# Patient Record
Sex: Male | Born: 1980 | Race: White | Hispanic: No | Marital: Single | Smoking: Current every day smoker
Health system: Southern US, Community
[De-identification: ages and names within clinical notes are randomized; demographics above are authoritative.]

## PROBLEM LIST (undated history)

## (undated) DIAGNOSIS — Z87442 Personal history of urinary calculi: Secondary | ICD-10-CM

## (undated) DIAGNOSIS — N200 Calculus of kidney: Secondary | ICD-10-CM

## (undated) DIAGNOSIS — N289 Disorder of kidney and ureter, unspecified: Secondary | ICD-10-CM

## (undated) HISTORY — PX: LITHOTRIPSY: SUR834

## (undated) HISTORY — PX: FINGER SURGERY: SHX640

---

## 2001-02-17 ENCOUNTER — Emergency Department (HOSPITAL_COMMUNITY): Admission: EM | Admit: 2001-02-17 | Discharge: 2001-02-17 | Payer: Self-pay | Admitting: Internal Medicine

## 2004-02-29 ENCOUNTER — Emergency Department (HOSPITAL_COMMUNITY): Admission: EM | Admit: 2004-02-29 | Discharge: 2004-02-29 | Payer: Self-pay | Admitting: Emergency Medicine

## 2004-03-08 ENCOUNTER — Ambulatory Visit (HOSPITAL_COMMUNITY): Admission: RE | Admit: 2004-03-08 | Discharge: 2004-03-08 | Payer: Self-pay | Admitting: Orthopaedic Surgery

## 2008-07-01 ENCOUNTER — Emergency Department (HOSPITAL_COMMUNITY): Admission: EM | Admit: 2008-07-01 | Discharge: 2008-07-02 | Payer: Self-pay | Admitting: Emergency Medicine

## 2010-07-02 NOTE — Op Note (Signed)
NAME:  Sean Mcdonald, Sean Mcdonald NO.:  [id number]   MEDICAL RECORD NO.:  [id number]          PATIENT TYPE:  OIB   LOCATION:  2899                         FACILITY:  [facility]   PHYSICIAN:  Mark C. Ophelia Charter, M.D.    DATE OF BIRTH:  [birth year]   DATE OF PROCEDURE:  03/08/2004  DATE OF DISCHARGE:  03/08/2004                                 OPERATIVE REPORT   PREOPERATIVE DIAGNOSIS:  Left long finger proximal phalanx fracture.   POSTOPERATIVE DIAGNOSIS:  Left long finger proximal phalanx fracture.   OPERATION PERFORMED:  Open reduction internal fixation of left long finger  proximal phalanx intra-articular fracture.   SURGEON:  Mark C. Ophelia Charter, M.D.   ESTIMATED BLOOD LOSS:  Less than 25 mL.   TOURNIQUET TIME:  None.   ANESTHESIA:  General orotracheal anesthesia.   DESCRIPTION OF PROCEDURE:  After induction of general anesthesia, the wrist  was prepped up to the midforearm level, extremity sheets were applied.  Under fluoroscopy, the finger was reduced and checked.  A stab incision was  made.  There was a fracture that was metaphyseal region of the left long  finger extended into the joint.  Initially, a small stab was made in the  skin, spread with a mosquito and 045 K-wire was used.  It was checked under  fluoroscopy with reduction maintained.  It was drilled across the fracture  site.  This held it with reduction maintained both externally with traction  and manual pressure.  A stab was made and a minifrag screw was placed after  drilling and placement of the screw.  Set screw was placed with some  difficulty with near anatomic position.  There was no angulated deformity.  Rotation looks good.  After irrigation, a single suture was placed for  closure of the skin incision that was made for the open reduction internal  fixation.  Hand dressing was applied.  The patient was taken to the recovery  room in stable condition.      MCY/MEDQ  D:  04/04/2004  T:  04/05/2004  Job:   [id number]

## 2013-10-13 DIAGNOSIS — N201 Calculus of ureter: Secondary | ICD-10-CM | POA: Insufficient documentation

## 2013-10-13 DIAGNOSIS — R109 Unspecified abdominal pain: Secondary | ICD-10-CM | POA: Insufficient documentation

## 2013-10-13 DIAGNOSIS — N200 Calculus of kidney: Secondary | ICD-10-CM | POA: Insufficient documentation

## 2016-04-06 DIAGNOSIS — Z87442 Personal history of urinary calculi: Secondary | ICD-10-CM | POA: Insufficient documentation

## 2016-06-17 ENCOUNTER — Encounter (HOSPITAL_COMMUNITY): Payer: Self-pay | Admitting: Emergency Medicine

## 2016-06-17 DIAGNOSIS — S80212A Abrasion, left knee, initial encounter: Secondary | ICD-10-CM | POA: Insufficient documentation

## 2016-06-17 DIAGNOSIS — S80211A Abrasion, right knee, initial encounter: Secondary | ICD-10-CM | POA: Insufficient documentation

## 2016-06-17 DIAGNOSIS — Y929 Unspecified place or not applicable: Secondary | ICD-10-CM | POA: Insufficient documentation

## 2016-06-17 DIAGNOSIS — Y999 Unspecified external cause status: Secondary | ICD-10-CM | POA: Insufficient documentation

## 2016-06-17 DIAGNOSIS — F17200 Nicotine dependence, unspecified, uncomplicated: Secondary | ICD-10-CM | POA: Insufficient documentation

## 2016-06-17 DIAGNOSIS — S0231XA Fracture of orbital floor, right side, initial encounter for closed fracture: Secondary | ICD-10-CM | POA: Insufficient documentation

## 2016-06-17 DIAGNOSIS — Y939 Activity, unspecified: Secondary | ICD-10-CM | POA: Insufficient documentation

## 2016-06-17 DIAGNOSIS — S50811A Abrasion of right forearm, initial encounter: Secondary | ICD-10-CM | POA: Insufficient documentation

## 2016-06-17 DIAGNOSIS — S022XXA Fracture of nasal bones, initial encounter for closed fracture: Secondary | ICD-10-CM | POA: Insufficient documentation
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
## 2016-06-18 ENCOUNTER — Emergency Department (HOSPITAL_COMMUNITY): Payer: Self-pay

## 2016-06-18 ENCOUNTER — Emergency Department (HOSPITAL_COMMUNITY)
Admission: EM | Admit: 2016-06-18 | Discharge: 2016-06-18 | Disposition: A | Discharge status: Home / Self Care | Payer: Self-pay | Attending: Emergency Medicine | Admitting: Emergency Medicine

## 2016-06-18 DIAGNOSIS — S0990XA Unspecified injury of head, initial encounter: Secondary | ICD-10-CM

## 2016-06-18 DIAGNOSIS — S022XXA Fracture of nasal bones, initial encounter for closed fracture: Secondary | ICD-10-CM

## 2016-06-18 DIAGNOSIS — T148XXA Other injury of unspecified body region, initial encounter: Secondary | ICD-10-CM

## 2016-06-18 DIAGNOSIS — S0285XA Fracture of orbit, unspecified, initial encounter for closed fracture: Secondary | ICD-10-CM

## 2016-06-18 MED ORDER — ONDANSETRON HCL 4 MG/2ML IJ SOLN
4.0000 mg | Freq: Once | INTRAMUSCULAR | Status: AC
  Administered 2016-06-18: 4 mg via INTRAVENOUS
  Filled 2016-06-18: qty 2

## 2016-06-18 MED ORDER — TETRACAINE HCL 0.5 % OP SOLN
1.0000 [drp] | Freq: Once | OPHTHALMIC | Status: AC
  Administered 2016-06-18: 1 [drp] via OPHTHALMIC
  Filled 2016-06-18: qty 4

## 2016-06-18 MED ORDER — HYDROMORPHONE HCL 1 MG/ML IJ SOLN
1.0000 mg | Freq: Once | INTRAMUSCULAR | Status: AC
  Administered 2016-06-18: 1 mg via INTRAVENOUS
  Filled 2016-06-18: qty 1

## 2016-06-18 MED ORDER — FLUORESCEIN SODIUM 0.6 MG OP STRP
1.0000 | ORAL_STRIP | Freq: Once | OPHTHALMIC | Status: AC
  Administered 2016-06-18: 1 via OPHTHALMIC
  Filled 2016-06-18: qty 1

## 2016-06-18 MED ORDER — HYDROCODONE-ACETAMINOPHEN 5-325 MG PO TABS: 1.0000 | ORAL_TABLET | ORAL | 0 refills | Status: DC | PRN

## 2016-06-18 NOTE — ED Notes (Signed)
Pt given breakfast tray
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
## 2016-06-18 NOTE — ED Notes (Signed)
Pt ambulated to the restroom without assistance

## 2016-12-30 ENCOUNTER — Emergency Department (HOSPITAL_BASED_OUTPATIENT_CLINIC_OR_DEPARTMENT_OTHER)

## 2016-12-30 ENCOUNTER — Encounter (HOSPITAL_BASED_OUTPATIENT_CLINIC_OR_DEPARTMENT_OTHER): Payer: Self-pay | Admitting: Emergency Medicine

## 2016-12-30 ENCOUNTER — Emergency Department (HOSPITAL_BASED_OUTPATIENT_CLINIC_OR_DEPARTMENT_OTHER)
Admission: EM | Admit: 2016-12-30 | Discharge: 2016-12-30 | Attending: Emergency Medicine | Admitting: Emergency Medicine

## 2016-12-30 ENCOUNTER — Other Ambulatory Visit: Payer: Self-pay

## 2016-12-30 DIAGNOSIS — Y929 Unspecified place or not applicable: Secondary | ICD-10-CM | POA: Diagnosis not present

## 2016-12-30 DIAGNOSIS — R51 Headache: Secondary | ICD-10-CM | POA: Insufficient documentation

## 2016-12-30 DIAGNOSIS — F17210 Nicotine dependence, cigarettes, uncomplicated: Secondary | ICD-10-CM | POA: Diagnosis not present

## 2016-12-30 DIAGNOSIS — Y9389 Activity, other specified: Secondary | ICD-10-CM | POA: Insufficient documentation

## 2016-12-30 DIAGNOSIS — M542 Cervicalgia: Secondary | ICD-10-CM | POA: Insufficient documentation

## 2016-12-30 DIAGNOSIS — M7918 Myalgia, other site: Secondary | ICD-10-CM | POA: Insufficient documentation

## 2016-12-30 DIAGNOSIS — M25512 Pain in left shoulder: Secondary | ICD-10-CM | POA: Diagnosis not present

## 2016-12-30 DIAGNOSIS — M25572 Pain in left ankle and joints of left foot: Secondary | ICD-10-CM | POA: Diagnosis present

## 2016-12-30 DIAGNOSIS — Y998 Other external cause status: Secondary | ICD-10-CM | POA: Diagnosis not present

## 2016-12-30 MED ORDER — CYCLOBENZAPRINE HCL 5 MG PO TABS: 5.0000 mg | ORAL_TABLET | Freq: Every evening | ORAL | 0 refills | Status: AC | PRN

## 2016-12-30 MED ORDER — NAPROXEN 500 MG PO TABS: 500.0000 mg | ORAL_TABLET | Freq: Two times a day (BID) | ORAL | 0 refills | Status: AC

## 2016-12-30 MED ORDER — ACETAMINOPHEN 500 MG PO TABS
1000.0000 mg | ORAL_TABLET | Freq: Once | ORAL | Status: AC
  Administered 2016-12-30: 1000 mg via ORAL
  Filled 2016-12-30: qty 2
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
## 2016-12-30 NOTE — Discharge Instructions (Signed)
Take naproxen twice a day with meals.  Do not take other anti-inflammatories at the same time open (Advil, Motrin, Ibuprofen, Aleve).  You may supplement with Tylenol if you need further pain control. Use Flexeril at night as needed for muscle stiffness or soreness.  Have caution, as this may make you tired/groggy. Use ice or heat this helps control your symptoms. Gently stretch the sore muscles.  You will likely have muscle stiffness and soreness over the next several days. If pain is not improving in a week, follow up with the primary care doctor for further evaluation. Return to the emergency room if you develop vision changes, slurred speech, new numbness, loss of bowel or bladder control, or any new or worsening symptoms.
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
## 2019-04-10 DIAGNOSIS — F331 Major depressive disorder, recurrent, moderate: Secondary | ICD-10-CM | POA: Insufficient documentation

## 2019-04-10 DIAGNOSIS — F1020 Alcohol dependence, uncomplicated: Secondary | ICD-10-CM | POA: Insufficient documentation

## 2019-04-10 DIAGNOSIS — F1510 Other stimulant abuse, uncomplicated: Secondary | ICD-10-CM | POA: Insufficient documentation

## 2019-04-10 DIAGNOSIS — Z720 Tobacco use: Secondary | ICD-10-CM | POA: Insufficient documentation

## 2019-09-24 ENCOUNTER — Encounter (HOSPITAL_COMMUNITY): Payer: Self-pay

## 2019-09-24 ENCOUNTER — Other Ambulatory Visit: Payer: Self-pay

## 2019-09-24 ENCOUNTER — Emergency Department (HOSPITAL_COMMUNITY)
Admission: EM | Admit: 2019-09-24 | Discharge: 2019-09-24 | Disposition: A | Discharge status: Home / Self Care | Attending: Emergency Medicine | Admitting: Emergency Medicine

## 2019-09-24 DIAGNOSIS — K4090 Unilateral inguinal hernia, without obstruction or gangrene, not specified as recurrent: Secondary | ICD-10-CM | POA: Insufficient documentation

## 2019-09-24 DIAGNOSIS — Z5321 Procedure and treatment not carried out due to patient leaving prior to being seen by health care provider: Secondary | ICD-10-CM | POA: Insufficient documentation

## 2019-09-24 HISTORY — DX: Disorder of kidney and ureter, unspecified: N28.9

## 2019-09-24 LAB — COMPREHENSIVE METABOLIC PANEL
ALT: 100 U/L — ABNORMAL HIGH (ref 0–44)
AST: 83 U/L — ABNORMAL HIGH (ref 15–41)
Albumin: 4.1 g/dL (ref 3.5–5.0)
Alkaline Phosphatase: 66 U/L (ref 38–126)
Anion gap: 10 (ref 5–15)
BUN: 11 mg/dL (ref 6–20)
CO2: 26 mmol/L (ref 22–32)
Calcium: 9.4 mg/dL (ref 8.9–10.3)
Chloride: 105 mmol/L (ref 98–111)
Creatinine, Ser: 0.87 mg/dL (ref 0.61–1.24)
GFR calc Af Amer: >60 mL/min (ref >60)
GFR calc non Af Amer: >60 mL/min (ref >60)
Glucose, Bld: 75 mg/dL (ref 70–99)
Potassium: 4.2 mmol/L (ref 3.5–5.1)
Sodium: 141 mmol/L (ref 135–145)
Total Bilirubin: 0.7 mg/dL (ref 0.3–1.2)
Total Protein: 7.5 g/dL (ref 6.5–8.1)

## 2019-09-24 LAB — URINALYSIS, ROUTINE W REFLEX MICROSCOPIC
Bacteria, UA: NONE SEEN
Bilirubin Urine: NEGATIVE
Glucose, UA: NEGATIVE mg/dL
Ketones, ur: NEGATIVE mg/dL
Leukocytes,Ua: NEGATIVE
Nitrite: NEGATIVE
Protein, ur: NEGATIVE mg/dL
RBC / HPF: >50 RBC/hpf — ABNORMAL HIGH (ref 0–5)
Specific Gravity, Urine: 1.021 (ref 1.005–1.030)
pH: 5 (ref 5.0–8.0)

## 2019-09-24 LAB — CBC WITH DIFFERENTIAL/PLATELET
Abs Immature Granulocytes: 0.02 10*3/uL (ref 0.00–0.07)
Basophils Absolute: 0.1 10*3/uL (ref 0.0–0.1)
Basophils Relative: 1 %
Eosinophils Absolute: 0.3 10*3/uL (ref 0.0–0.5)
Eosinophils Relative: 5 %
HCT: 48.8 % (ref 39.0–52.0)
Hemoglobin: 15.7 g/dL (ref 13.0–17.0)
Immature Granulocytes: 0 %
Lymphocytes Relative: 26 %
Lymphs Abs: 1.6 10*3/uL (ref 0.7–4.0)
MCH: 30.1 pg (ref 26.0–34.0)
MCHC: 32.2 g/dL (ref 30.0–36.0)
MCV: 93.5 fL (ref 80.0–100.0)
Monocytes Absolute: 0.6 10*3/uL (ref 0.1–1.0)
Monocytes Relative: 10 %
Neutro Abs: 3.6 10*3/uL (ref 1.7–7.7)
Neutrophils Relative %: 58 %
Platelets: 231 10*3/uL (ref 150–400)
RBC: 5.22 MIL/uL (ref 4.22–5.81)
RDW: 13.5 % (ref 11.5–15.5)
WBC: 6.2 10*3/uL (ref 4.0–10.5)
nRBC: 0 % (ref 0.0–0.2)

## 2019-09-24 NOTE — ED Triage Notes (Signed)
Pt reports has had left inguinal hernia for the past year.  Reports has been gradually getting bigger and has intermittent pain.  Reports pain got worse yesterday after lifting something.  Describes as feeling like something pulling in lower abd.  Denies any n/v/d.

## 2019-09-27 ENCOUNTER — Encounter (HOSPITAL_COMMUNITY): Payer: Self-pay | Admitting: Emergency Medicine

## 2019-09-27 ENCOUNTER — Emergency Department (HOSPITAL_COMMUNITY)
Admission: EM | Admit: 2019-09-27 | Discharge: 2019-09-27 | Disposition: A | Discharge status: Home / Self Care | Payer: Self-pay | Attending: Emergency Medicine | Admitting: Emergency Medicine

## 2019-09-27 ENCOUNTER — Emergency Department (HOSPITAL_COMMUNITY): Payer: Self-pay

## 2019-09-27 ENCOUNTER — Other Ambulatory Visit: Payer: Self-pay

## 2019-09-27 DIAGNOSIS — K4090 Unilateral inguinal hernia, without obstruction or gangrene, not specified as recurrent: Secondary | ICD-10-CM | POA: Insufficient documentation

## 2019-09-27 DIAGNOSIS — F17210 Nicotine dependence, cigarettes, uncomplicated: Secondary | ICD-10-CM | POA: Insufficient documentation

## 2019-09-27 DIAGNOSIS — R1030 Lower abdominal pain, unspecified: Secondary | ICD-10-CM | POA: Insufficient documentation

## 2019-09-27 HISTORY — DX: Calculus of kidney: N20.0

## 2019-09-27 LAB — COMPREHENSIVE METABOLIC PANEL
ALT: 83 U/L — ABNORMAL HIGH (ref 0–44)
AST: 63 U/L — ABNORMAL HIGH (ref 15–41)
Albumin: 3.6 g/dL (ref 3.5–5.0)
Alkaline Phosphatase: 59 U/L (ref 38–126)
Anion gap: 8 (ref 5–15)
BUN: 18 mg/dL (ref 6–20)
CO2: 24 mmol/L (ref 22–32)
Calcium: 8.8 mg/dL — ABNORMAL LOW (ref 8.9–10.3)
Chloride: 104 mmol/L (ref 98–111)
Creatinine, Ser: 0.79 mg/dL (ref 0.61–1.24)
GFR calc Af Amer: >60 mL/min (ref >60)
GFR calc non Af Amer: >60 mL/min (ref >60)
Glucose, Bld: 96 mg/dL (ref 70–99)
Potassium: 4.1 mmol/L (ref 3.5–5.1)
Sodium: 136 mmol/L (ref 135–145)
Total Bilirubin: 0.5 mg/dL (ref 0.3–1.2)
Total Protein: 7 g/dL (ref 6.5–8.1)

## 2019-09-27 LAB — URINALYSIS, ROUTINE W REFLEX MICROSCOPIC
Bacteria, UA: NONE SEEN
Bilirubin Urine: NEGATIVE
Glucose, UA: NEGATIVE mg/dL
Ketones, ur: NEGATIVE mg/dL
Leukocytes,Ua: NEGATIVE
Nitrite: NEGATIVE
Protein, ur: NEGATIVE mg/dL
RBC / HPF: >50 RBC/hpf — ABNORMAL HIGH (ref 0–5)
Specific Gravity, Urine: 1.026 (ref 1.005–1.030)
pH: 5 (ref 5.0–8.0)

## 2019-09-27 LAB — CBC WITH DIFFERENTIAL/PLATELET
Abs Immature Granulocytes: 0.02 10*3/uL (ref 0.00–0.07)
Basophils Absolute: 0.1 10*3/uL (ref 0.0–0.1)
Basophils Relative: 1 %
Eosinophils Absolute: 0.3 10*3/uL (ref 0.0–0.5)
Eosinophils Relative: 4 %
HCT: 46.7 % (ref 39.0–52.0)
Hemoglobin: 14.7 g/dL (ref 13.0–17.0)
Immature Granulocytes: 0 %
Lymphocytes Relative: 23 %
Lymphs Abs: 1.6 10*3/uL (ref 0.7–4.0)
MCH: 29.3 pg (ref 26.0–34.0)
MCHC: 31.5 g/dL (ref 30.0–36.0)
MCV: 93 fL (ref 80.0–100.0)
Monocytes Absolute: 0.8 10*3/uL (ref 0.1–1.0)
Monocytes Relative: 11 %
Neutro Abs: 4.1 10*3/uL (ref 1.7–7.7)
Neutrophils Relative %: 61 %
Platelets: 216 10*3/uL (ref 150–400)
RBC: 5.02 MIL/uL (ref 4.22–5.81)
RDW: 13.4 % (ref 11.5–15.5)
WBC: 6.8 10*3/uL (ref 4.0–10.5)
nRBC: 0 % (ref 0.0–0.2)

## 2019-09-27 MED ORDER — KETOROLAC TROMETHAMINE 60 MG/2ML IM SOLN
60.0000 mg | Freq: Once | INTRAMUSCULAR | Status: AC
  Administered 2019-09-27: 60 mg via INTRAMUSCULAR
  Filled 2019-09-27: qty 2

## 2019-09-27 MED ORDER — HYDROCODONE-ACETAMINOPHEN 5-325 MG PO TABS: 1.0000 | ORAL_TABLET | Freq: Four times a day (QID) | ORAL | 0 refills | Status: DC | PRN

## 2019-09-27 NOTE — ED Provider Notes (Signed)
[facility] EMERGENCY DEPARTMENT Provider Note   CSN: [id number] Arrival date & time: 09/27/19  6948     History No chief complaint on file.   Sean Mcdonald is a 39 y.o. male.  Patient complains of left inguinal pain.  Patient has a history of an inguinal hernia that he is supposed to see surgery about  The history is provided by the patient.  Abdominal Pain Pain location: Left inguinal area. Pain quality: aching   Pain radiates to:  Does not radiate Pain severity:  Mild Onset quality:  Gradual Timing:  Constant Progression:  Worsening Chronicity:  Recurrent Context: not alcohol use   Relieved by:  Nothing Worsened by:  Nothing Ineffective treatments:  None tried Associated symptoms: no chest pain, no cough, no diarrhea, no fatigue and no hematuria        Past Medical History:  Diagnosis Date  . Kidney stones   . Kidney stones   . Renal disorder    kidney stones    There are no problems to display for this patient.   Past Surgical History:  Procedure Laterality Date  . FINGER SURGERY    . LITHOTRIPSY         History reviewed. No pertinent family history.  Social History   Tobacco Use  . Smoking status: Current Every Day Smoker    Packs/day: 0.50    Types: Cigarettes  . Smokeless tobacco: Never Used  Vaping Use  . Vaping Use: Never used  Substance Use Topics  . Alcohol use: No  . Drug use: Never    Home Medications Prior to Admission medications   Medication Sig Start Date End Date Taking? Authorizing Provider  HYDROcodone-acetaminophen (NORCO/VICODIN) 5-325 MG tablet Take 1-2 tablets by mouth every 4 (four) hours as needed for moderate pain. 06/18/16   Orpah Greek, MD    Allergies    Patient has no known allergies.  Review of Systems   Review of Systems  Constitutional: Negative for appetite change and fatigue.  HENT: Negative for congestion, ear discharge and sinus pressure.   Eyes: Negative for discharge.  Respiratory:  Negative for cough.   Cardiovascular: Negative for chest pain.  Gastrointestinal: Positive for abdominal pain. Negative for diarrhea.  Genitourinary: Negative for frequency and hematuria.  Musculoskeletal: Negative for back pain.  Skin: Negative for rash.  Neurological: Negative for seizures and headaches.  Psychiatric/Behavioral: Negative for hallucinations.    Physical Exam Updated Vital Signs BP 124/77 (BP Location: Left Arm)   Pulse 68   Temp 97.9 F (36.6 C) (Oral)   Resp 18   Ht 6\' 1"  (1.854 m)   Wt 81.6 kg   SpO2 98%   BMI 23.75 kg/m   Physical Exam Vitals and nursing note reviewed.  Constitutional:      Appearance: He is well-developed.  HENT:     Head: Normocephalic.     Nose: Nose normal.  Eyes:     General: No scleral icterus.    Conjunctiva/sclera: Conjunctivae normal.  Neck:     Thyroid: No thyromegaly.  Cardiovascular:     Rate and Rhythm: Normal rate and regular rhythm.     Heart sounds: No murmur heard.  No friction rub. No gallop.   Pulmonary:     Breath sounds: No stridor. No wheezing or rales.  Chest:     Chest wall: No tenderness.  Abdominal:     General: There is no distension.     Tenderness: There is no abdominal tenderness. There  is no rebound.  Genitourinary:    Comments: Tender left inguinal area.   Mass consistent with hernia Musculoskeletal:        General: Normal range of motion.     Cervical back: Neck supple.  Lymphadenopathy:     Cervical: No cervical adenopathy.  Skin:    Findings: No erythema or rash.  Neurological:     Mental Status: He is alert and oriented to person, place, and time.     Motor: No abnormal muscle tone.     Coordination: Coordination normal.  Psychiatric:        Behavior: Behavior normal.     ED Results / Procedures / Treatments   Labs (all labs ordered are listed, but only abnormal results are displayed) Labs Reviewed  URINALYSIS, ROUTINE W REFLEX MICROSCOPIC - Abnormal; Notable for the following  components:      Result Value   Hgb urine dipstick LARGE (*)    RBC / HPF >50 (*)    All other components within normal limits  COMPREHENSIVE METABOLIC PANEL - Abnormal; Notable for the following components:   Calcium 8.8 (*)    AST 63 (*)    ALT 83 (*)    All other components within normal limits  URINE CULTURE  CBC WITH DIFFERENTIAL/PLATELET    EKG None  Radiology CT Renal Stone Study  Result Date: 09/27/2019 CLINICAL DATA:  Left lower quadrant abdominal pain for 1 year. EXAM: CT ABDOMEN AND PELVIS WITHOUT CONTRAST TECHNIQUE: Multidetector CT imaging of the abdomen and pelvis was performed following the standard protocol without IV contrast. COMPARISON:  12/19/2017 FINDINGS: Lower chest: Minimal reticular scarring, lateral base of the right lower lobe, stable. Lung bases otherwise clear. Hepatobiliary: No focal liver abnormality is seen. No gallstones, gallbladder wall thickening, or biliary dilatation. Pancreas: Unremarkable. No pancreatic ductal dilatation or surrounding inflammatory changes. Spleen: Normal in size without focal abnormality. Adrenals/Urinary Tract: No adrenal masses. Kidneys normal in size, orientation and position. Small nonobstructing stones in the left kidney. Low-attenuation masses or lesions, medial mid to lower pole and lower pole of the right kidney, 12 mm each, not fully characterized but likely cysts. No other renal masses. No hydronephrosis. Normal ureters. Normal bladder. Stomach/Bowel: Stomach is within normal limits. Appendix appears normal. No evidence of bowel wall thickening, distention, or inflammatory changes. Vascular/Lymphatic: No significant vascular findings are present. No enlarged abdominal or pelvic lymph nodes. Reproductive: Unremarkable. Other: Fat containing left inguinal hernia. No other abdominal wall hernia. No ascites. Musculoskeletal: No acute or significant osseous findings. IMPRESSION: 1. No acute findings within the abdomen or pelvis. 2.  Small fat containing left inguinal hernia new/increased in size from the prior CT, potentially a cause of left lower quadrant pain. 3. No ureteral stone or obstructive uropathy. Small nonobstructing stones in the left kidney. 4. Two small low-density lesions in the right kidney, likely cyst but not fully characterized. Recommend non urgent follow-up assessment with renal ultrasound Electronically Signed   By: Lajean Manes M.D.   On: 09/27/2019 12:05    Procedures Procedures (including critical care time)  Medications Ordered in ED Medications  ketorolac (TORADOL) injection 60 mg (60 mg Intramuscular Given 09/27/19 1158)    ED Course  I have reviewed the triage vital signs and the nursing notes.  Pertinent labs & imaging results that were available during my care of the patient were reviewed by me and considered in my medical decision making (see chart for details).    MDM Rules/Calculators/A&P  Patient with left inguinal hernia which he has been referred back to surgery.  Patient also with hematuria and he is referred to urology     This patient presents to the ED for concern of inguinal pain, this involves an extensive number of treatment options, and is a complaint that carries with it a high risk of complications and morbidity.  The differential diagnosis includes inguinal hernia kidney stone  Lab Tests:  I Ordered, reviewed, and interpreted labs, which included CBC chemistries which were unremarkable.  Patient does have elevated liver enzymes Medicines ordered:   I ordered medication Toradol for pain  Imaging Studies ordered:   I ordered imaging studies which included CT abdomen  I independently visualized and interpreted imaging which showed kidney stones and kidney and left inguinal hernia no strangulation chest felt same  Additional history obtained:   Additional history obtained from records  Previous records obtained and  reviewed.  Consultations Obtained:   Reevaluation:  After the interventions stated above, I reevaluated the patient and found improved  Critical Interventions:  .   Final Clinical Impression(s) / ED Diagnoses Final diagnoses:  Lower abdominal pain    Rx / DC Orders ED Discharge Orders    None       Milton Ferguson, MD 09/29/19 1347

## 2019-09-27 NOTE — Discharge Instructions (Addendum)
Follow-up with Dr. Arnoldo Morale to get your hernia repaired.  Follow-up with [facility] to check on the blood in your urine.  Do no lifting until you get that hernia fixed.  Return if any problems

## 2019-09-27 NOTE — ED Notes (Signed)
Pt given strainer at discharge. Pt left prior to e-signature being obtained. Pt verbalized understanding of discharge instructions and need for follow-up.

## 2019-09-27 NOTE — ED Notes (Signed)
Patient transported to CT 

## 2019-09-27 NOTE — ED Triage Notes (Signed)
Patient states he has a hernia around his groin area the size of a golf ball.  abd pain and a pulling sensation.

## 2019-09-27 NOTE — ED Provider Notes (Signed)
[facility] EMERGENCY DEPARTMENT Provider Note   CSN: [id number] Arrival date & time: 09/27/19  7510     History No chief complaint on file.   Sean Mcdonald is a 39 y.o. male.  Patient complains of lower abdominal pain.  He states he has been having this pain lately and has been doing some lifting and it is worse  The history is provided by the patient and medical records. No language interpreter was used.  Abdominal Pain Pain location: Left inguinal pain. Pain quality: aching   Pain radiates to:  Does not radiate Pain severity:  Moderate Onset quality:  Sudden Progression:  Worsening Chronicity:  New Context: not alcohol use   Associated symptoms: no chest pain, no cough, no diarrhea, no fatigue and no hematuria        Past Medical History:  Diagnosis Date  . Kidney stones   . Kidney stones   . Renal disorder    kidney stones    There are no problems to display for this patient.   Past Surgical History:  Procedure Laterality Date  . FINGER SURGERY    . LITHOTRIPSY         History reviewed. No pertinent family history.  Social History   Tobacco Use  . Smoking status: Current Every Day Smoker    Packs/day: 0.50    Types: Cigarettes  . Smokeless tobacco: Never Used  Vaping Use  . Vaping Use: Never used  Substance Use Topics  . Alcohol use: No  . Drug use: Never    Home Medications Prior to Admission medications   Medication Sig Start Date End Date Taking? Authorizing Provider  HYDROcodone-acetaminophen (NORCO/VICODIN) 5-325 MG tablet Take 1-2 tablets by mouth every 4 (four) hours as needed for moderate pain. 06/18/16   Orpah Greek, MD    Allergies    Patient has no known allergies.  Review of Systems   Review of Systems  Constitutional: Negative for appetite change and fatigue.  HENT: Negative for congestion, ear discharge and sinus pressure.   Eyes: Negative for discharge.  Respiratory: Negative for cough.   Cardiovascular:  Negative for chest pain.  Gastrointestinal: Positive for abdominal pain. Negative for diarrhea.  Genitourinary: Negative for frequency and hematuria.  Musculoskeletal: Negative for back pain.  Skin: Negative for rash.  Neurological: Negative for seizures and headaches.  Psychiatric/Behavioral: Negative for hallucinations.    Physical Exam Updated Vital Signs BP 124/77 (BP Location: Left Arm)   Pulse 68   Temp 97.9 F (36.6 C) (Oral)   Resp 18   Ht 6\' 1"  (1.854 m)   Wt 81.6 kg   SpO2 98%   BMI 23.75 kg/m   Physical Exam Vitals and nursing note reviewed.  Constitutional:      Appearance: He is well-developed.  HENT:     Head: Normocephalic.     Nose: Nose normal.  Eyes:     General: No scleral icterus.    Conjunctiva/sclera: Conjunctivae normal.  Neck:     Thyroid: No thyromegaly.  Cardiovascular:     Rate and Rhythm: Normal rate and regular rhythm.     Heart sounds: No murmur heard.  No friction rub. No gallop.   Pulmonary:     Breath sounds: No stridor. No wheezing or rales.  Chest:     Chest wall: No tenderness.  Abdominal:     General: There is no distension.     Tenderness: There is abdominal tenderness. There is no rebound.  Comments: Patient has an inguinal hernia on the left side that is reducible  Musculoskeletal:        General: Normal range of motion.     Cervical back: Neck supple.  Lymphadenopathy:     Cervical: No cervical adenopathy.  Skin:    Findings: No erythema or rash.  Neurological:     Mental Status: He is alert and oriented to person, place, and time.     Motor: No abnormal muscle tone.     Coordination: Coordination normal.  Psychiatric:        Behavior: Behavior normal.     ED Results / Procedures / Treatments   Labs (all labs ordered are listed, but only abnormal results are displayed) Labs Reviewed  URINALYSIS, ROUTINE W REFLEX MICROSCOPIC - Abnormal; Notable for the following components:      Result Value   Hgb urine  dipstick LARGE (*)    RBC / HPF >50 (*)    All other components within normal limits  COMPREHENSIVE METABOLIC PANEL - Abnormal; Notable for the following components:   Calcium 8.8 (*)    AST 63 (*)    ALT 83 (*)    All other components within normal limits  URINE CULTURE  CBC WITH DIFFERENTIAL/PLATELET    EKG None  Radiology CT Renal Stone Study  Result Date: 09/27/2019 CLINICAL DATA:  Left lower quadrant abdominal pain for 1 year. EXAM: CT ABDOMEN AND PELVIS WITHOUT CONTRAST TECHNIQUE: Multidetector CT imaging of the abdomen and pelvis was performed following the standard protocol without IV contrast. COMPARISON:  12/19/2017 FINDINGS: Lower chest: Minimal reticular scarring, lateral base of the right lower lobe, stable. Lung bases otherwise clear. Hepatobiliary: No focal liver abnormality is seen. No gallstones, gallbladder wall thickening, or biliary dilatation. Pancreas: Unremarkable. No pancreatic ductal dilatation or surrounding inflammatory changes. Spleen: Normal in size without focal abnormality. Adrenals/Urinary Tract: No adrenal masses. Kidneys normal in size, orientation and position. Small nonobstructing stones in the left kidney. Low-attenuation masses or lesions, medial mid to lower pole and lower pole of the right kidney, 12 mm each, not fully characterized but likely cysts. No other renal masses. No hydronephrosis. Normal ureters. Normal bladder. Stomach/Bowel: Stomach is within normal limits. Appendix appears normal. No evidence of bowel wall thickening, distention, or inflammatory changes. Vascular/Lymphatic: No significant vascular findings are present. No enlarged abdominal or pelvic lymph nodes. Reproductive: Unremarkable. Other: Fat containing left inguinal hernia. No other abdominal wall hernia. No ascites. Musculoskeletal: No acute or significant osseous findings. IMPRESSION: 1. No acute findings within the abdomen or pelvis. 2. Small fat containing left inguinal hernia  new/increased in size from the prior CT, potentially a cause of left lower quadrant pain. 3. No ureteral stone or obstructive uropathy. Small nonobstructing stones in the left kidney. 4. Two small low-density lesions in the right kidney, likely cyst but not fully characterized. Recommend non urgent follow-up assessment with renal ultrasound Electronically Signed   By: Lajean Manes M.D.   On: 09/27/2019 12:05    Procedures Procedures (including critical care time)  Medications Ordered in ED Medications  ketorolac (TORADOL) injection 60 mg (60 mg Intramuscular Given 09/27/19 1158)    ED Course  I have reviewed the triage vital signs and the nursing notes.  Pertinent labs & imaging results that were available during my care of the patient were reviewed by me and considered in my medical decision making (see chart for details).    MDM Rules/Calculators/A&P  Patient with inguinal hernia which is reducible.  Patient will be referred to general surgery.  Patient also has hematuria and he will be referred to urology       This patient presents to the ED for concern of patient with left inguinal pain, this involves an extensive number of treatment options, and is a complaint that carries with it a high risk of complications and morbidity.  The differential diagnosis includes inguinal hernia   Lab Tests:   I Ordered, reviewed, and interpreted labs, which included CBC and chemistries unremarkable.  Patient had urinalysis that showed hematuria  Medicines ordered:   I ordered medication Toradol for pain  Imaging Studies ordered:   I ordered imaging studies which included CT abdomen and  I independently visualized and interpreted imaging which showed showed inguinal hernia  Additional history obtained:   Additional history obtained from records  Previous records obtained and reviewed.  Consultations Obtained:   Patient was referred to urology and  general surgery  Reevaluation:  After the interventions stated above, I reevaluated the patient and found mild improvement  Critical Interventions:  .   Final Clinical Impression(s) / ED Diagnoses Final diagnoses:  Lower abdominal pain    Rx / DC Orders ED Discharge Orders    None       Milton Ferguson, MD 09/27/19 1401

## 2019-09-28 LAB — URINE CULTURE: Culture: NO GROWTH

## 2019-10-01 ENCOUNTER — Encounter: Payer: Self-pay | Admitting: General Surgery

## 2019-10-01 ENCOUNTER — Other Ambulatory Visit: Payer: Self-pay

## 2019-10-01 ENCOUNTER — Ambulatory Visit (INDEPENDENT_AMBULATORY_CARE_PROVIDER_SITE_OTHER): Payer: Self-pay | Admitting: General Surgery

## 2019-10-01 VITALS — BP 160/80 | HR 88 | Temp 98.2°F | Resp 12 | Ht 73.0 in | Wt 172.0 lb

## 2019-10-01 DIAGNOSIS — K4090 Unilateral inguinal hernia, without obstruction or gangrene, not specified as recurrent: Secondary | ICD-10-CM

## 2019-10-01 NOTE — Patient Instructions (Signed)

## 2019-10-02 NOTE — Progress Notes (Signed)
Sean Mcdonald; [id number]; [birth year]   HPI Patient is a 39 year old white male who was presented to my office from the emergency room for evaluation and treatment of a left inguinal hernia.  He states it has been present for many months, but is increased in size and is causing him discomfort.  He is having a difficult time reducing the hernia.  It is affecting his work. Past Medical History:  Diagnosis Date   Kidney stones    Kidney stones    Renal disorder    kidney stones    Past Surgical History:  Procedure Laterality Date   FINGER SURGERY     LITHOTRIPSY      History reviewed. No pertinent family history.  Current Outpatient Medications on File Prior to Visit  Medication Sig Dispense Refill   citalopram (CELEXA) 20 MG tablet Take by mouth.     FLUoxetine (PROZAC) 10 MG capsule Take 1 capsule by mouth daily.     topiramate (TOPAMAX) 50 MG tablet Take by mouth.     No current facility-administered medications on file prior to visit.    No Known Allergies  Social History   Substance and Sexual Activity  Alcohol Use Yes   Comment: occasionally    Social History   Tobacco Use  Smoking Status Current Every Day Smoker   Packs/day: 0.50   Types: Cigarettes  Smokeless Tobacco Never Used    Review of Systems  Constitutional: Positive for malaise/fatigue.  HENT: Negative.   Eyes: Positive for pain.  Respiratory: Negative.   Cardiovascular: Negative.   Gastrointestinal: Positive for abdominal pain.  Genitourinary: Negative.   Musculoskeletal: Negative.   Skin: Negative.   Neurological: Negative.   Endo/Heme/Allergies: Negative.   Psychiatric/Behavioral: Negative.     Objective   Vitals:   10/01/19 1028  BP: (!) 160/80  Pulse: 88  Resp: 12  Temp: 98.2 F (36.8 C)  SpO2: 97%    Physical Exam Vitals reviewed.  Constitutional:      Appearance: Normal appearance. He is normal weight. He is not ill-appearing.  HENT:     Head: Normocephalic and  atraumatic.  Cardiovascular:     Rate and Rhythm: Normal rate and regular rhythm.     Heart sounds: Normal heart sounds. No murmur heard.  No friction rub. No gallop.   Pulmonary:     Effort: Pulmonary effort is normal. No respiratory distress.     Breath sounds: Normal breath sounds. No stridor. No wheezing, rhonchi or rales.  Abdominal:     General: Abdomen is flat. There is no distension.     Palpations: Abdomen is soft. There is no mass.     Tenderness: There is no abdominal tenderness. There is no guarding or rebound.     Hernia: A hernia is present.     Comments: Reducible left inguinal hernia noted.  Genitourinary:    Comments: Genitourinary examination is within normal limits. Skin:    General: Skin is warm and dry.  Neurological:     Mental Status: He is alert and oriented to person, place, and time.   ER notes reviewed  Assessment  Left inguinal hernia Plan   Patient is scheduled for left inguinal herniorrhaphy with mesh on 10/09/2019.  The risks and benefits of the procedure including bleeding, infection, mesh use, the possibility of recurrence of the hernia were fully explained to the patient, who gave informed consent.

## 2019-10-02 NOTE — H&P (Signed)
Sean Mcdonald; [id number]; 1980/10/13   HPI Patient is a 38 year old white male who was presented to my office from the emergency room for evaluation and treatment of a left inguinal hernia.  He states it has been present for many months, but is increased in size and is causing him discomfort.  He is having a difficult time reducing the hernia.  It is affecting his work. Past Medical History:  Diagnosis Date  . Kidney stones   . Kidney stones   . Renal disorder    kidney stones    Past Surgical History:  Procedure Laterality Date  . FINGER SURGERY    . LITHOTRIPSY      History reviewed. No pertinent family history.  Current Outpatient Medications on File Prior to Visit  Medication Sig Dispense Refill  . citalopram (CELEXA) 20 MG tablet Take by mouth.    Marland Kitchen FLUoxetine (PROZAC) 10 MG capsule Take 1 capsule by mouth daily.    Marland Kitchen topiramate (TOPAMAX) 50 MG tablet Take by mouth.     No current facility-administered medications on file prior to visit.    No Known Allergies  Social History   Substance and Sexual Activity  Alcohol Use Yes   Comment: occasionally    Social History   Tobacco Use  Smoking Status Current Every Day Smoker  . Packs/day: 0.50  . Types: Cigarettes  Smokeless Tobacco Never Used    Review of Systems  Constitutional: Positive for malaise/fatigue.  HENT: Negative.   Eyes: Positive for pain.  Respiratory: Negative.   Cardiovascular: Negative.   Gastrointestinal: Positive for abdominal pain.  Genitourinary: Negative.   Musculoskeletal: Negative.   Skin: Negative.   Neurological: Negative.   Endo/Heme/Allergies: Negative.   Psychiatric/Behavioral: Negative.     Objective   Vitals:   10/01/19 1028  BP: (!) 160/80  Pulse: 88  Resp: 12  Temp: 98.2 F (36.8 C)  SpO2: 97%    Physical Exam Vitals reviewed.  Constitutional:      Appearance: Normal appearance. He is normal weight. He is not ill-appearing.  HENT:     Head: Normocephalic and  atraumatic.  Cardiovascular:     Rate and Rhythm: Normal rate and regular rhythm.     Heart sounds: Normal heart sounds. No murmur heard.  No friction rub. No gallop.   Pulmonary:     Effort: Pulmonary effort is normal. No respiratory distress.     Breath sounds: Normal breath sounds. No stridor. No wheezing, rhonchi or rales.  Abdominal:     General: Abdomen is flat. There is no distension.     Palpations: Abdomen is soft. There is no mass.     Tenderness: There is no abdominal tenderness. There is no guarding or rebound.     Hernia: A hernia is present.     Comments: Reducible left inguinal hernia noted.  Genitourinary:    Comments: Genitourinary examination is within normal limits. Skin:    General: Skin is warm and dry.  Neurological:     Mental Status: He is alert and oriented to person, place, and time.   ER notes reviewed  Assessment  Left inguinal hernia Plan   Patient is scheduled for left inguinal herniorrhaphy with mesh on 10/09/2019.  The risks and benefits of the procedure including bleeding, infection, mesh use, the possibility of recurrence of the hernia were fully explained to the patient, who gave informed consent.

## 2019-10-03 ENCOUNTER — Encounter (HOSPITAL_COMMUNITY): Payer: Self-pay

## 2019-10-07 ENCOUNTER — Other Ambulatory Visit: Payer: Self-pay

## 2019-10-07 ENCOUNTER — Encounter (HOSPITAL_COMMUNITY)
Admission: RE | Admit: 2019-10-07 | Discharge: 2019-10-07 | Disposition: A | Discharge status: Home / Self Care | Payer: Self-pay | Source: Ambulatory Visit | Attending: General Surgery | Admitting: General Surgery

## 2019-10-07 ENCOUNTER — Other Ambulatory Visit (HOSPITAL_COMMUNITY)
Admission: RE | Admit: 2019-10-07 | Discharge: 2019-10-07 | Disposition: A | Discharge status: Home / Self Care | Payer: Self-pay | Source: Ambulatory Visit | Attending: General Surgery | Admitting: General Surgery

## 2019-10-07 ENCOUNTER — Encounter (HOSPITAL_COMMUNITY): Payer: Self-pay

## 2019-10-07 DIAGNOSIS — Z20822 Contact with and (suspected) exposure to covid-19: Secondary | ICD-10-CM | POA: Insufficient documentation

## 2019-10-07 DIAGNOSIS — Z01812 Encounter for preprocedural laboratory examination: Secondary | ICD-10-CM | POA: Insufficient documentation

## 2019-10-07 HISTORY — DX: Personal history of urinary calculi: Z87.442

## 2019-10-07 NOTE — Patient Instructions (Signed)
Sean Mcdonald  10/07/2019     @PREFPERIOPPHARMACY @   Your procedure is scheduled on  10/09/2019.  Report to [facility] at  [phone]  A.M.  Call this number if you have problems the morning of surgery:  [phone]   Remember:  Do not eat or drink after midnight.                         Take these medicines the morning of surgery with A SIP OF WATER  Celexa, prozac, topamax.    Do not wear jewelry, make-up or nail polish.  Do not wear lotions, powders, or perfumes. Please wear deodorant and brush your teeth.  Do not shave 48 hours prior to surgery.  Men may shave face and neck.  Do not bring valuables to the hospital.  [facility] is not responsible for any belongings or valuables.  Contacts, dentures or bridgework may not be worn into surgery.  Leave your suitcase in the car.  After surgery it may be brought to your room.  For patients admitted to the hospital, discharge time will be determined by your treatment team.  Patients discharged the day of surgery will not be allowed to drive home.   Name and phone number of your driver:   family Special instructions:  DO NOT smoke the morning of your surgery.  Please read over the following fact sheets that you were given. Anesthesia Post-op Instructions and Care and Recovery After Surgery       Open Hernia Repair, Adult, Care After These instructions give you information about caring for yourself after your procedure. Your doctor may also give you more specific instructions. If you have problems or questions, contact your doctor. Follow these instructions at home: Surgical cut (incision) care   Follow instructions from your doctor about how to take care of your surgical cut area. Make sure you: ? Wash your hands with soap and water before you change your bandage (dressing). If you cannot use soap and water, use hand sanitizer. ? Change your bandage as told by your doctor. ? Leave stitches (sutures), skin glue, or  skin tape (adhesive) strips in place. They may need to stay in place for 2 weeks or longer. If tape strips get loose and curl up, you may trim the loose edges. Do not remove tape strips completely unless your doctor says it is okay.  Check your surgical cut every day for signs of infection. Check for: ? More redness, swelling, or pain. ? More fluid or blood. ? Warmth. ? Pus or a bad smell. Activity  Do not drive or use heavy machinery while taking prescription pain medicine. Do not drive until your doctor says it is okay.  Until your doctor says it is okay: ? Do not lift anything that is heavier than 10 lb (4.5 kg). ? Do not play contact sports.  Return to your normal activities as told by your doctor. Ask your doctor what activities are safe. General instructions  To prevent or treat having a hard time pooping (constipation) while you are taking prescription pain medicine, your doctor may recommend that you: ? Drink enough fluid to keep your pee (urine) clear or pale yellow. ? Take over-the-counter or prescription medicines. ? Eat foods that are high in fiber, such as fresh fruits and vegetables, whole grains, and beans. ? Limit foods that are high in fat and processed sugars, such as fried  and sweet foods.  Take over-the-counter and prescription medicines only as told by your doctor.  Do not take baths, swim, or use a hot tub until your doctor says it is okay.  Keep all follow-up visits as told by your doctor. This is important. Contact a doctor if:  You develop a rash.  You have more redness, swelling, or pain around your surgical cut.  You have more fluid or blood coming from your surgical cut.  Your surgical cut feels warm to the touch.  You have pus or a bad smell coming from your surgical cut.  You have a fever or chills.  You have blood in your poop (stool).  You have not pooped in 2-3 days.  Medicine does not help your pain. Get help right away if:  You have  chest pain or you are short of breath.  You feel light-headed.  You feel weak and dizzy (feel faint).  You have very bad pain.  You throw up (vomit) and your pain is worse. This information is not intended to replace advice given to you by your health care provider. Make sure you discuss any questions you have with your health care provider. Document Revised: 05/25/2018 Document Reviewed: 07/15/2015 Elsevier Patient Education  2020 [place] Anesthesia, Adult, Care After This sheet gives you information about how to care for yourself after your procedure. Your health care provider may also give you more specific instructions. If you have problems or questions, contact your health care provider. What can I expect after the procedure? After the procedure, the following side effects are common:  Pain or discomfort at the IV site.  Nausea.  Vomiting.  Sore throat.  Trouble concentrating.  Feeling cold or chills.  Weak or tired.  Sleepiness and fatigue.  Soreness and body aches. These side effects can affect parts of the body that were not involved in surgery. Follow these instructions at home:  For at least 24 hours after the procedure:  Have a responsible adult stay with you. It is important to have someone help care for you until you are awake and alert.  Rest as needed.  Do not: ? Participate in activities in which you could fall or become injured. ? Drive. ? Use heavy machinery. ? Drink alcohol. ? Take sleeping pills or medicines that cause drowsiness. ? Make important decisions or sign legal documents. ? Take care of children on your own. Eating and drinking  Follow any instructions from your health care provider about eating or drinking restrictions.  When you feel hungry, start by eating small amounts of foods that are soft and easy to digest (bland), such as toast. Gradually return to your regular diet.  Drink enough fluid to keep your urine  pale yellow.  If you vomit, rehydrate by drinking water, juice, or clear broth. General instructions  If you have sleep apnea, surgery and certain medicines can increase your risk for breathing problems. Follow instructions from your health care provider about wearing your sleep device: ? Anytime you are sleeping, including during daytime naps. ? While taking prescription pain medicines, sleeping medicines, or medicines that make you drowsy.  Return to your normal activities as told by your health care provider. Ask your health care provider what activities are safe for you.  Take over-the-counter and prescription medicines only as told by your health care provider.  If you smoke, do not smoke without supervision.  Keep all follow-up visits as told by your health care provider.  This is important. Contact a health care provider if:  You have nausea or vomiting that does not get better with medicine.  You cannot eat or drink without vomiting.  You have pain that does not get better with medicine.  You are unable to pass urine.  You develop a skin rash.  You have a fever.  You have redness around your IV site that gets worse. Get help right away if:  You have difficulty breathing.  You have chest pain.  You have blood in your urine or stool, or you vomit blood. Summary  After the procedure, it is common to have a sore throat or nausea. It is also common to feel tired.  Have a responsible adult stay with you for the first 24 hours after general anesthesia. It is important to have someone help care for you until you are awake and alert.  When you feel hungry, start by eating small amounts of foods that are soft and easy to digest (bland), such as toast. Gradually return to your regular diet.  Drink enough fluid to keep your urine pale yellow.  Return to your normal activities as told by your health care provider. Ask your health care provider what activities are safe for  you. This information is not intended to replace advice given to you by your health care provider. Make sure you discuss any questions you have with your health care provider. Document Revised: 02/03/2017 Document Reviewed: 09/16/2016 Elsevier Patient Education  [place]. How to Use Chlorhexidine for Bathing Chlorhexidine gluconate (CHG) is a germ-killing (antiseptic) solution that is used to clean the skin. It can get rid of the bacteria that normally live on the skin and can keep them away for about 24 hours. To clean your skin with CHG, you may be given:  A CHG solution to use in the shower or as part of a sponge bath.  A prepackaged cloth that contains CHG. Cleaning your skin with CHG may help lower the risk for infection:  While you are staying in the intensive care unit of the hospital.  If you have a vascular access, such as a central line, to provide short-term or long-term access to your veins.  If you have a catheter to drain urine from your bladder.  If you are on a ventilator. A ventilator is a machine that helps you breathe by moving air in and out of your lungs.  After surgery. What are the risks? Risks of using CHG include:  A skin reaction.  Hearing loss, if CHG gets in your ears.  Eye injury, if CHG gets in your eyes and is not rinsed out.  The CHG product catching fire. Make sure that you avoid smoking and flames after applying CHG to your skin. Do not use CHG:  If you have a chlorhexidine allergy or have previously reacted to chlorhexidine.  On babies younger than 42 months of age. How to use CHG solution  Use CHG only as told by your health care provider, and follow the instructions on the label.  Use the full amount of CHG as directed. Usually, this is one bottle. During a shower Follow these steps when using CHG solution during a shower (unless your health care provider gives you different instructions): 1. Start the shower. 2. Use your  normal soap and shampoo to wash your face and hair. 3. Turn off the shower or move out of the shower stream. 4. Pour the CHG onto a clean washcloth. Do  not use any type of brush or rough-edged sponge. 5. Starting at your neck, lather your body down to your toes. Make sure you follow these instructions: ? If you will be having surgery, pay special attention to the part of your body where you will be having surgery. Scrub this area for at least 1 minute. ? Do not use CHG on your head or face. If the solution gets into your ears or eyes, rinse them well with water. ? Avoid your genital area. ? Avoid any areas of skin that have broken skin, cuts, or scrapes. ? Scrub your back and under your arms. Make sure to wash skin folds. 6. Let the lather sit on your skin for 1-2 minutes or as long as told by your health care provider. 7. Thoroughly rinse your entire body in the shower. Make sure that all body creases and crevices are rinsed well. 8. Dry off with a clean towel. Do not put any substances on your body afterward--such as powder, lotion, or perfume--unless you are told to do so by your health care provider. Only use lotions that are recommended by the manufacturer. 9. Put on clean clothes or pajamas. 10. If it is the night before your surgery, sleep in clean sheets.  During a sponge bath Follow these steps when using CHG solution during a sponge bath (unless your health care provider gives you different instructions): 1. Use your normal soap and shampoo to wash your face and hair. 2. Pour the CHG onto a clean washcloth. 3. Starting at your neck, lather your body down to your toes. Make sure you follow these instructions: ? If you will be having surgery, pay special attention to the part of your body where you will be having surgery. Scrub this area for at least 1 minute. ? Do not use CHG on your head or face. If the solution gets into your ears or eyes, rinse them well with water. ? Avoid your  genital area. ? Avoid any areas of skin that have broken skin, cuts, or scrapes. ? Scrub your back and under your arms. Make sure to wash skin folds. 4. Let the lather sit on your skin for 1-2 minutes or as long as told by your health care provider. 5. Using a different clean, wet washcloth, thoroughly rinse your entire body. Make sure that all body creases and crevices are rinsed well. 6. Dry off with a clean towel. Do not put any substances on your body afterward--such as powder, lotion, or perfume--unless you are told to do so by your health care provider. Only use lotions that are recommended by the manufacturer. 7. Put on clean clothes or pajamas. 8. If it is the night before your surgery, sleep in clean sheets. How to use CHG prepackaged cloths  Only use CHG cloths as told by your health care provider, and follow the instructions on the label.  Use the CHG cloth on clean, dry skin.  Do not use the CHG cloth on your head or face unless your health care provider tells you to.  When washing with the CHG cloth: ? Avoid your genital area. ? Avoid any areas of skin that have broken skin, cuts, or scrapes. Before surgery Follow these steps when using a CHG cloth to clean before surgery (unless your health care provider gives you different instructions): 1. Using the CHG cloth, vigorously scrub the part of your body where you will be having surgery. Scrub using a back-and-forth motion for 3 minutes. The  area on your body should be completely wet with CHG when you are done scrubbing. 2. Do not rinse. Discard the cloth and let the area air-dry. Do not put any substances on the area afterward, such as powder, lotion, or perfume. 3. Put on clean clothes or pajamas. 4. If it is the night before your surgery, sleep in clean sheets.  For general bathing Follow these steps when using CHG cloths for general bathing (unless your health care provider gives you different instructions). 1. Use a  separate CHG cloth for each area of your body. Make sure you wash between any folds of skin and between your fingers and toes. Wash your body in the following order, switching to a new cloth after each step: ? The front of your neck, shoulders, and chest. ? Both of your arms, under your arms, and your hands. ? Your stomach and groin area, avoiding the genitals. ? Your right leg and foot. ? Your left leg and foot. ? The back of your neck, your back, and your buttocks. 2. Do not rinse. Discard the cloth and let the area air-dry. Do not put any substances on your body afterward--such as powder, lotion, or perfume--unless you are told to do so by your health care provider. Only use lotions that are recommended by the manufacturer. 3. Put on clean clothes or pajamas. Contact a health care provider if:  Your skin gets irritated after scrubbing.  You have questions about using your solution or cloth. Get help right away if:  Your eyes become very red or swollen.  Your eyes itch badly.  Your skin itches badly and is red or swollen.  Your hearing changes.  You have trouble seeing.  You have swelling or tingling in your mouth or throat.  You have trouble breathing.  You swallow any chlorhexidine. Summary  Chlorhexidine gluconate (CHG) is a germ-killing (antiseptic) solution that is used to clean the skin. Cleaning your skin with CHG may help to lower your risk for infection.  You may be given CHG to use for bathing. It may be in a bottle or in a prepackaged cloth to use on your skin. Carefully follow your health care provider's instructions and the instructions on the product label.  Do not use CHG if you have a chlorhexidine allergy.  Contact your health care provider if your skin gets irritated after scrubbing. This information is not intended to replace advice given to you by your health care provider. Make sure you discuss any questions you have with your health care  provider. Document Revised: 04/19/2018 Document Reviewed: 12/29/2016 Elsevier Patient Education  [place].

## 2019-10-08 LAB — SARS CORONAVIRUS 2 (TAT 6-24 HRS): SARS Coronavirus 2: NEGATIVE

## 2019-10-09 ENCOUNTER — Ambulatory Visit (HOSPITAL_COMMUNITY): Payer: Self-pay | Admitting: Anesthesiology

## 2019-10-09 ENCOUNTER — Encounter (HOSPITAL_COMMUNITY)
Admission: RE | Disposition: A | Discharge status: Home / Self Care | Payer: Self-pay | Source: Home / Self Care | Attending: General Surgery

## 2019-10-09 ENCOUNTER — Ambulatory Visit (HOSPITAL_COMMUNITY)
Admission: RE | Admit: 2019-10-09 | Discharge: 2019-10-09 | Disposition: A | Discharge status: Home / Self Care | Payer: Self-pay | Attending: General Surgery | Admitting: General Surgery

## 2019-10-09 ENCOUNTER — Encounter (HOSPITAL_COMMUNITY): Payer: Self-pay | Admitting: General Surgery

## 2019-10-09 DIAGNOSIS — K4090 Unilateral inguinal hernia, without obstruction or gangrene, not specified as recurrent: Secondary | ICD-10-CM | POA: Insufficient documentation

## 2019-10-09 DIAGNOSIS — Z79899 Other long term (current) drug therapy: Secondary | ICD-10-CM | POA: Insufficient documentation

## 2019-10-09 DIAGNOSIS — F17210 Nicotine dependence, cigarettes, uncomplicated: Secondary | ICD-10-CM | POA: Insufficient documentation

## 2019-10-09 DIAGNOSIS — D176 Benign lipomatous neoplasm of spermatic cord: Secondary | ICD-10-CM | POA: Insufficient documentation

## 2019-10-09 HISTORY — PX: INGUINAL HERNIA REPAIR: SHX194

## 2019-10-09 SURGERY — REPAIR, HERNIA, INGUINAL, ADULT
Anesthesia: General | Site: Abdomen | Laterality: Left

## 2019-10-09 MED ORDER — DEXMEDETOMIDINE HCL 200 MCG/2ML IV SOLN
INTRAVENOUS | Status: DC | PRN
  Administered 2019-10-09 (×2): 4 ug via INTRAVENOUS

## 2019-10-09 MED ORDER — FENTANYL CITRATE (PF) 100 MCG/2ML IJ SOLN
INTRAMUSCULAR | Status: AC
  Filled 2019-10-09: qty 2

## 2019-10-09 MED ORDER — CHLORHEXIDINE GLUCONATE CLOTH 2 % EX PADS: 6.0000 | MEDICATED_PAD | Freq: Once | CUTANEOUS | Status: DC

## 2019-10-09 MED ORDER — DEXAMETHASONE SODIUM PHOSPHATE 10 MG/ML IJ SOLN
INTRAMUSCULAR | Status: AC
  Filled 2019-10-09: qty 1

## 2019-10-09 MED ORDER — LIDOCAINE 2% (20 MG/ML) 5 ML SYRINGE
INTRAMUSCULAR | Status: AC
  Filled 2019-10-09: qty 5

## 2019-10-09 MED ORDER — MIDAZOLAM HCL 2 MG/2ML IJ SOLN
INTRAMUSCULAR | Status: AC
  Filled 2019-10-09: qty 2

## 2019-10-09 MED ORDER — FENTANYL CITRATE (PF) 100 MCG/2ML IJ SOLN
INTRAMUSCULAR | Status: DC | PRN
Start: 2019-10-09 — End: 2019-10-09
  Administered 2019-10-09 (×4): 50 ug via INTRAVENOUS

## 2019-10-09 MED ORDER — ONDANSETRON HCL 4 MG/2ML IJ SOLN: 4.0000 mg | Freq: Once | INTRAMUSCULAR | Status: DC | PRN

## 2019-10-09 MED ORDER — ORAL CARE MOUTH RINSE: 15.0000 mL | Freq: Once | OROMUCOSAL | Status: AC

## 2019-10-09 MED ORDER — CEFAZOLIN SODIUM-DEXTROSE 2-4 GM/100ML-% IV SOLN
2.0000 g | INTRAVENOUS | Status: AC
  Administered 2019-10-09: 2 g via INTRAVENOUS
  Filled 2019-10-09: qty 100

## 2019-10-09 MED ORDER — HYDROMORPHONE HCL 1 MG/ML IJ SOLN
0.2500 mg | INTRAMUSCULAR | Status: DC | PRN
  Administered 2019-10-09: 0.5 mg via INTRAVENOUS
  Filled 2019-10-09: qty 0.5

## 2019-10-09 MED ORDER — ONDANSETRON HCL 4 MG/2ML IJ SOLN
INTRAMUSCULAR | Status: AC
  Filled 2019-10-09: qty 2

## 2019-10-09 MED ORDER — LIDOCAINE 2% (20 MG/ML) 5 ML SYRINGE
INTRAMUSCULAR | Status: DC | PRN
  Administered 2019-10-09: 60 mg via INTRAVENOUS

## 2019-10-09 MED ORDER — PHENYLEPHRINE 40 MCG/ML (10ML) SYRINGE FOR IV PUSH (FOR BLOOD PRESSURE SUPPORT)
PREFILLED_SYRINGE | INTRAVENOUS | Status: DC | PRN
  Administered 2019-10-09 (×2): 80 ug via INTRAVENOUS

## 2019-10-09 MED ORDER — MIDAZOLAM HCL 5 MG/5ML IJ SOLN
INTRAMUSCULAR | Status: DC | PRN
  Administered 2019-10-09: 2 mg via INTRAVENOUS

## 2019-10-09 MED ORDER — SODIUM CHLORIDE 0.9 % IR SOLN
Status: DC | PRN
  Administered 2019-10-09: 1000 mL

## 2019-10-09 MED ORDER — BUPIVACAINE LIPOSOME 1.3 % IJ SUSP
INTRAMUSCULAR | Status: AC
  Filled 2019-10-09: qty 10

## 2019-10-09 MED ORDER — PHENYLEPHRINE 40 MCG/ML (10ML) SYRINGE FOR IV PUSH (FOR BLOOD PRESSURE SUPPORT)
PREFILLED_SYRINGE | INTRAVENOUS | Status: AC
  Filled 2019-10-09: qty 10

## 2019-10-09 MED ORDER — PROPOFOL 10 MG/ML IV BOLUS
INTRAVENOUS | Status: DC | PRN
  Administered 2019-10-09: 200 mg via INTRAVENOUS

## 2019-10-09 MED ORDER — EPHEDRINE 5 MG/ML INJ
INTRAVENOUS | Status: AC
  Filled 2019-10-09: qty 10

## 2019-10-09 MED ORDER — KETOROLAC TROMETHAMINE 30 MG/ML IJ SOLN
30.0000 mg | Freq: Once | INTRAMUSCULAR | Status: AC
  Administered 2019-10-09: 30 mg via INTRAVENOUS
  Filled 2019-10-09: qty 1

## 2019-10-09 MED ORDER — ONDANSETRON HCL 4 MG/2ML IJ SOLN
INTRAMUSCULAR | Status: DC | PRN
  Administered 2019-10-09: 4 mg via INTRAVENOUS

## 2019-10-09 MED ORDER — LACTATED RINGERS IV SOLN: INTRAVENOUS | Status: DC

## 2019-10-09 MED ORDER — DEXAMETHASONE SODIUM PHOSPHATE 4 MG/ML IJ SOLN
INTRAMUSCULAR | Status: DC | PRN
  Administered 2019-10-09: 10 mg via INTRAVENOUS

## 2019-10-09 MED ORDER — CHLORHEXIDINE GLUCONATE 0.12 % MT SOLN
15.0000 mL | Freq: Once | OROMUCOSAL | Status: AC
  Administered 2019-10-09: 15 mL via OROMUCOSAL

## 2019-10-09 MED ORDER — OXYCODONE-ACETAMINOPHEN 10-325 MG PO TABS
1.0000 | ORAL_TABLET | Freq: Four times a day (QID) | ORAL | 0 refills | Status: DC | PRN
Start: 2019-10-09 — End: 2019-11-12

## 2019-10-09 MED ORDER — BUPIVACAINE LIPOSOME 1.3 % IJ SUSP
INTRAMUSCULAR | Status: DC | PRN
  Administered 2019-10-09: 20 mL

## 2019-10-09 MED ORDER — EPHEDRINE SULFATE-NACL 50-0.9 MG/10ML-% IV SOSY
PREFILLED_SYRINGE | INTRAVENOUS | Status: DC | PRN
  Administered 2019-10-09: 10 mg via INTRAVENOUS

## 2019-10-09 SURGICAL SUPPLY — 45 items
ADH SKN CLS APL DERMABOND .7 (GAUZE/BANDAGES/DRESSINGS) ×1
CLOTH BEACON ORANGE TIMEOUT ST (SAFETY) ×3 IMPLANT
COVER LIGHT HANDLE STERIS (MISCELLANEOUS) ×6 IMPLANT
COVER WAND RF STERILE (DRAPES) ×3 IMPLANT
DERMABOND ADVANCED (GAUZE/BANDAGES/DRESSINGS) ×2
DERMABOND ADVANCED .7 DNX12 (GAUZE/BANDAGES/DRESSINGS) ×1 IMPLANT
DRAIN PENROSE 0.5X18 (DRAIN) ×3 IMPLANT
ELECT REM PT RETURN 9FT ADLT (ELECTROSURGICAL) ×3
ELECTRODE REM PT RTRN 9FT ADLT (ELECTROSURGICAL) ×1 IMPLANT
GAUZE SPONGE 4X4 12PLY STRL (GAUZE/BANDAGES/DRESSINGS) ×6 IMPLANT
GLOVE BIOGEL M 7.0 STRL (GLOVE) ×3 IMPLANT
GLOVE BIOGEL PI IND STRL 7.0 (GLOVE) ×4 IMPLANT
GLOVE BIOGEL PI IND STRL 7.5 (GLOVE) ×1 IMPLANT
GLOVE BIOGEL PI IND STRL 8 (GLOVE) ×1 IMPLANT
GLOVE BIOGEL PI INDICATOR 7.0 (GLOVE) ×8
GLOVE BIOGEL PI INDICATOR 7.5 (GLOVE) ×2
GLOVE BIOGEL PI INDICATOR 8 (GLOVE) ×2
GLOVE SS BIOGEL STRL SZ 7 (GLOVE) ×1 IMPLANT
GLOVE SUPERSENSE BIOGEL SZ 7 (GLOVE) ×2
GLOVE SURG SS PI 7.5 STRL IVOR (GLOVE) ×3 IMPLANT
GOWN STRL REUS W/TWL LRG LVL3 (GOWN DISPOSABLE) ×9 IMPLANT
INST SET MINOR GENERAL (KITS) ×3 IMPLANT
KIT TURNOVER KIT A (KITS) ×3 IMPLANT
MANIFOLD NEPTUNE II (INSTRUMENTS) ×3 IMPLANT
MESH HERNIA 1.6X1.9 PLUG LRG (Mesh General) ×1 IMPLANT
MESH HERNIA PLUG LRG (Mesh General) ×2 IMPLANT
NEEDLE HYPO 18GX1.5 BLUNT FILL (NEEDLE) ×3 IMPLANT
NEEDLE HYPO 22GX1.5 SAFETY (NEEDLE) ×3 IMPLANT
NS IRRIG 1000ML POUR BTL (IV SOLUTION) ×3 IMPLANT
PACK MINOR (CUSTOM PROCEDURE TRAY) ×3 IMPLANT
PAD ARMBOARD 7.5X6 YLW CONV (MISCELLANEOUS) ×3 IMPLANT
PENCIL SMOKE EVACUATOR (MISCELLANEOUS) ×6 IMPLANT
SET BASIN LINEN APH (SET/KITS/TRAYS/PACK) ×3 IMPLANT
SOL PREP PROV IODINE SCRUB 4OZ (MISCELLANEOUS) ×3 IMPLANT
SUT MNCRL AB 4-0 PS2 18 (SUTURE) ×3 IMPLANT
SUT NOVA NAB GS-22 2 2-0 T-19 (SUTURE) ×6 IMPLANT
SUT SILK 3 0 (SUTURE)
SUT SILK 3-0 18XBRD TIE 12 (SUTURE) IMPLANT
SUT VIC AB 2-0 CT1 27 (SUTURE) ×3
SUT VIC AB 2-0 CT1 TAPERPNT 27 (SUTURE) ×1 IMPLANT
SUT VIC AB 3-0 SH 27 (SUTURE) ×3
SUT VIC AB 3-0 SH 27X BRD (SUTURE) ×1 IMPLANT
SUT VICRYL AB 2 0 TIES (SUTURE) IMPLANT
SUT VICRYL AB 3 0 TIES (SUTURE) IMPLANT
SYR 20ML LL LF (SYRINGE) ×6 IMPLANT

## 2019-10-09 NOTE — Anesthesia Preprocedure Evaluation (Signed)
Anesthesia Evaluation  Patient identified by MRN, date of birth, ID band Patient awake    Reviewed: Allergy & Precautions, H&P , NPO status , Patient's Chart, lab work & pertinent test results, reviewed documented beta blocker date and time   Airway Mallampati: I  TM Distance: >3 FB Neck ROM: full    Dental no notable dental hx. (+) Teeth Intact   Pulmonary neg pulmonary ROS, Current Smoker,    Pulmonary exam normal breath sounds clear to auscultation       Cardiovascular Exercise Tolerance: Good negative cardio ROS   Rhythm:regular Rate:Normal     Neuro/Psych negative neurological ROS  negative psych ROS   GI/Hepatic negative GI ROS, Neg liver ROS,   Endo/Other  negative endocrine ROS  Renal/GU negative Renal ROS  negative genitourinary   Musculoskeletal   Abdominal   Peds  Hematology negative hematology ROS (+)   Anesthesia Other Findings   Reproductive/Obstetrics negative OB ROS                             Anesthesia Physical Anesthesia Plan  ASA: I  Anesthesia Plan: General   Post-op Pain Management:    Induction:   PONV Risk Score and Plan: Ondansetron  Airway Management Planned:   Additional Equipment:   Intra-op Plan:   Post-operative Plan:   Informed Consent: I have reviewed the patients History and Physical, chart, labs and discussed the procedure including the risks, benefits and alternatives for the proposed anesthesia with the patient or authorized representative who has indicated his/her understanding and acceptance.     Dental Advisory Given  Plan Discussed with: CRNA  Anesthesia Plan Comments:         Anesthesia Quick Evaluation

## 2019-10-09 NOTE — Transfer of Care (Signed)
Immediate Anesthesia Transfer of Care Note  Patient: Sean Mcdonald  Procedure(s) Performed: HERNIA REPAIR INGUINAL ADULT W/MESH (Left Abdomen)  Patient Location: PACU  Anesthesia Type:General  Level of Consciousness: awake  Airway & Oxygen Therapy: Patient Spontanous Breathing and Patient connected to nasal cannula oxygen  Post-op Assessment: Report given to RN and Post -op Vital signs reviewed and stable  Post vital signs: Reviewed and stable  Last Vitals:  Vitals Value Taken Time  BP    Temp    Pulse    Resp    SpO2      Last Pain:  Vitals:   10/09/19 0920  PainSc: 3          Complications: No complications documented.

## 2019-10-09 NOTE — Anesthesia Postprocedure Evaluation (Signed)
Anesthesia Post Note  Patient: Sean Mcdonald  Procedure(s) Performed: HERNIA REPAIR INGUINAL ADULT W/MESH (Left Abdomen)  Anesthesia Type: General   No complications documented.   Last Vitals:  Vitals:   10/09/19 1116 10/09/19 1130  BP: 119/79 118/80  Pulse: 64 69  Resp: 11 14  Temp: 36.4 C   SpO2: 100% 100%    Last Pain:  Vitals:   10/09/19 1116  PainSc: 6                  Suvan Stcyr

## 2019-10-09 NOTE — Op Note (Signed)
Patient:  Sean Mcdonald  DOB:  1980-05-08  MRN:  [id number]   Preop Diagnosis: Left inguinal hernia  Postop Diagnosis: Same  Procedure: Left inguinal herniorrhaphy with mesh  Surgeon: Aviva Signs, MD  Anes: General  Indications: Patient is a 39 year old white male who presents with a symptomatic left inguinal hernia.  The risks and benefits of the procedure including bleeding, infection, mesh use, and the possibility of recurrence of the hernia were fully explained to the patient, who gave informed consent.  Procedure note: The patient was placed in the supine position.  After general anesthesia was administered, the left groin region was prepped and draped using the usual sterile technique with Betadine.  Surgical site confirmation was performed.  An incision was made in the left groin region down to the external oblique aponeurosis.  The aponeurosis was incised to the external ring.  A Penrose drain was placed around the spermatic cord.  The vas deferens was noted within the spermatic cord.  The ilioinguinal nerve was identified and kept from the operative field.  The patient was noted to have a direct hernia sac.  This was freed away from the transversalis fascia and inverted.  A large Bard PerFix plug was then inserted.  An onlay patch was placed along the floor of the inguinal canal and secured superiorly to the conjoined tendon and inferiorly to the shelving edge of Poupart's ligament using 2-0 Novafil interrupted sutures.  The internal ring was recreated using a 2-0 Novafil interrupted suture.  A small lipoma of the cord was excised up to the peritoneal reflection.  The external Bleich aponeurosis was reapproximated using a 2-0 Vicryl running suture.  The subcutaneous layer was reapproximated using a 3-0 Vicryl interrupted suture.  The skin was closed using a 4-0 Monocryl subcuticular suture.  Exparel was instilled into the surrounding wound.  Dermabond was then applied.  All tape and  needle counts were correct at the end of the procedure.  The patient was awakened and transferred to PACU in stable condition.  Complications: None  EBL: Minimal  Specimen: None

## 2019-10-09 NOTE — Anesthesia Procedure Notes (Signed)
Procedure Name: LMA Insertion Date/Time: 10/09/2019 10:12 AM Performed by: Lieutenant Diego, CRNA Pre-anesthesia Checklist: Patient identified, Emergency Drugs available, Suction available and Patient being monitored Patient Re-evaluated:Patient Re-evaluated prior to induction Oxygen Delivery Method: Circle system utilized Preoxygenation: Pre-oxygenation with 100% oxygen Induction Type: IV induction Ventilation: Mask ventilation without difficulty LMA: LMA inserted LMA Size: 4.0 Number of attempts: 1 Placement Confirmation: positive ETCO2 Tube secured with: Tape Dental Injury: Teeth and Oropharynx as per pre-operative assessment

## 2019-10-09 NOTE — Discharge Instructions (Signed)
[facility] THE [place] [facility] UNTIL Sunday AUGUST 29,2021. DO NOT USE ANY FURTHER NUMBING MEDICATIONS WITHOUT CONSULTING A PHYSICIAN     Open Hernia Repair, Adult, Care After This sheet gives you information about how to care for yourself after your procedure. Your health care provider may also give you more specific instructions. If you have problems or questions, contact your health care provider. What can I expect after the procedure? After the procedure, it is common to have:  Mild discomfort.  Slight bruising.  Minor swelling.  Pain in the abdomen. Follow these instructions at home: Incision care   Follow instructions from your health care provider about how to take care of your incision area. Make sure you: ? Wash your hands with soap and water before you change your bandage (dressing). If soap and water are not available, use hand sanitizer. ? Change your dressing as told by your health care provider. ? Leave stitches (sutures), skin glue, or adhesive strips in place. These skin closures may need to stay in place for 2 weeks or longer. If adhesive strip edges start to loosen and curl up, you may trim the loose edges. Do not remove adhesive strips completely unless your health care provider tells you to do that.  Check your incision area every day for signs of infection. Check for: ? More redness, swelling, or pain. ? More fluid or blood. ? Warmth. ? Pus or a bad smell. Activity  Do not drive or use heavy machinery while taking prescription pain medicine. Do not drive until your health care provider approves.  Until your health care provider approves: ? Do not lift anything that is heavier than 10 lb (4.5 kg). ? Do not play contact sports.  Return to your normal activities as told by your health care provider. Ask your health care provider what activities are safe. General instructions  To prevent or treat constipation while you are taking prescription pain  medicine, your health care provider may recommend that you: ? Drink enough fluid to keep your urine clear or pale yellow. ? Take over-the-counter or prescription medicines. ? Eat foods that are high in fiber, such as fresh fruits and vegetables, whole grains, and beans. ? Limit foods that are high in fat and processed sugars, such as fried and sweet foods.  Take over-the-counter and prescription medicines only as told by your health care provider.  Do not take tub baths or go swimming until your health care provider approves.  Keep all follow-up visits as told by your health care provider. This is important. Contact a health care provider if:  You develop a rash.  You have more redness, swelling, or pain around your incision.  You have more fluid or blood coming from your incision.  Your incision feels warm to the touch.  You have pus or a bad smell coming from your incision.  You have a fever or chills.  You have blood in your stool (feces).  You have not had a bowel movement in 2-3 days.  Your pain is not controlled with medicine. Get help right away if:  You have chest pain or shortness of breath.  You feel light-headed or feel faint.  You have severe pain.  You vomit and your pain is worse. This information is not intended to replace advice given to you by your health care provider. Make sure you discuss any questions you have with your health care provider. Document Revised: 01/13/2017 Document Reviewed: 07/15/2015 Elsevier Patient Education  [place] Anesthesia, Adult, Care After This sheet gives you information about how to care for yourself after your procedure. Your health care provider may also give you more specific instructions. If you have problems or questions, contact your health care provider. What can I expect after the procedure? After the procedure, the following side effects are common:  Pain or discomfort at the IV  site.  Nausea.  Vomiting.  Sore throat.  Trouble concentrating.  Feeling cold or chills.  Weak or tired.  Sleepiness and fatigue.  Soreness and body aches. These side effects can affect parts of the body that were not involved in surgery. Follow these instructions at home:  For at least 24 hours after the procedure:  Have a responsible adult stay with you. It is important to have someone help care for you until you are awake and alert.  Rest as needed.  Do not: ? Participate in activities in which you could fall or become injured. ? Drive. ? Use heavy machinery. ? Drink alcohol. ? Take sleeping pills or medicines that cause drowsiness. ? Make important decisions or sign legal documents. ? Take care of children on your own. Eating and drinking  Follow any instructions from your health care provider about eating or drinking restrictions.  When you feel hungry, start by eating small amounts of foods that are soft and easy to digest (bland), such as toast. Gradually return to your regular diet.  Drink enough fluid to keep your urine pale yellow.  If you vomit, rehydrate by drinking water, juice, or clear broth. General instructions  If you have sleep apnea, surgery and certain medicines can increase your risk for breathing problems. Follow instructions from your health care provider about wearing your sleep device: ? Anytime you are sleeping, including during daytime naps. ? While taking prescription pain medicines, sleeping medicines, or medicines that make you drowsy.  Return to your normal activities as told by your health care provider. Ask your health care provider what activities are safe for you.  Take over-the-counter and prescription medicines only as told by your health care provider.  If you smoke, do not smoke without supervision.  Keep all follow-up visits as told by your health care provider. This is important. Contact a health care provider if:  You  have nausea or vomiting that does not get better with medicine.  You cannot eat or drink without vomiting.  You have pain that does not get better with medicine.  You are unable to pass urine.  You develop a skin rash.  You have a fever.  You have redness around your IV site that gets worse. Get help right away if:  You have difficulty breathing.  You have chest pain.  You have blood in your urine or stool, or you vomit blood. Summary  After the procedure, it is common to have a sore throat or nausea. It is also common to feel tired.  Have a responsible adult stay with you for the first 24 hours after general anesthesia. It is important to have someone help care for you until you are awake and alert.  When you feel hungry, start by eating small amounts of foods that are soft and easy to digest (bland), such as toast. Gradually return to your regular diet.  Drink enough fluid to keep your urine pale yellow.  Return to your normal activities as told by your health care provider. Ask your health care provider what activities  are safe for you. This information is not intended to replace advice given to you by your health care provider. Make sure you discuss any questions you have with your health care provider. Document Revised: 02/03/2017 Document Reviewed: 09/16/2016 Elsevier Patient Education  [place].

## 2019-10-09 NOTE — Interval H&P Note (Signed)
History and Physical Interval Note:  10/09/2019 9:40 AM  Sean Mcdonald  has presented today for surgery, with the diagnosis of Left inguinal hernia.  The various methods of treatment have been discussed with the patient and family. After consideration of risks, benefits and other options for treatment, the patient has consented to  Procedure(s) with comments: HERNIA REPAIR INGUINAL ADULT W/MESH (Left) - pt knows to arrive at 8:15 as a surgical intervention.  The patient's history has been reviewed, patient examined, no change in status, stable for surgery.  I have reviewed the patient's chart and labs.  Questions were answered to the patient's satisfaction.     Aviva Signs

## 2019-10-10 ENCOUNTER — Encounter (HOSPITAL_COMMUNITY): Payer: Self-pay | Admitting: General Surgery

## 2019-10-22 ENCOUNTER — Telehealth (INDEPENDENT_AMBULATORY_CARE_PROVIDER_SITE_OTHER): Payer: Self-pay | Admitting: General Surgery

## 2019-10-22 DIAGNOSIS — Z09 Encounter for follow-up examination after completed treatment for conditions other than malignant neoplasm: Secondary | ICD-10-CM

## 2019-10-22 NOTE — Progress Notes (Signed)
Subjective:     Sean Mcdonald  Virtual telephone visit performed.  Patient states he is progressing well.  The swelling has gone down.  He is trying to resume basic activities.  He is still not lifting anything heavy. Objective:    There were no vitals taken for this visit.  General:  alert, cooperative and no distress       Assessment:    Doing well postoperatively.    Plan:   Patient will call to schedule follow-up appointment in approximately 3 weeks.  He will need a note to return back to work.  Will assess him at that time. Total telephone time 3 minutes.

## 2019-11-12 ENCOUNTER — Encounter: Payer: Self-pay | Admitting: General Surgery

## 2019-11-12 ENCOUNTER — Encounter: Payer: Self-pay | Admitting: Family Medicine

## 2019-11-12 ENCOUNTER — Ambulatory Visit (INDEPENDENT_AMBULATORY_CARE_PROVIDER_SITE_OTHER): Payer: Self-pay | Admitting: General Surgery

## 2019-11-12 ENCOUNTER — Other Ambulatory Visit: Payer: Self-pay

## 2019-11-12 VITALS — BP 129/81 | HR 90 | Temp 98.7°F | Resp 16 | Ht 73.0 in | Wt 168.0 lb

## 2019-11-12 DIAGNOSIS — Z09 Encounter for follow-up examination after completed treatment for conditions other than malignant neoplasm: Secondary | ICD-10-CM

## 2019-11-12 NOTE — Progress Notes (Signed)
Subjective:     Sean Mcdonald  Here for postoperative visit.  Doing well.  Has no complaints. Objective:    BP 129/81   Pulse 90   Temp 98.7 F (37.1 C) (Oral)   Resp 16   Ht 6\' 1"  (1.854 m)   Wt 168 lb (76.2 kg)   SpO2 96%   BMI 22.16 kg/m   General:  alert, cooperative and no distress  Abdomen soft, incision well-healed.     Assessment:    Doing well postoperatively.    Plan:   May return to work on 11/13/2019 without restrictions.  Follow-up here as needed.

## 2021-04-26 IMAGING — CT CT RENAL STONE PROTOCOL
2 of 4 series · 16 of 46 positions shown, 18 images · non-contrast
Comparison: 12/19/2017

CLINICAL DATA: Left lower quadrant abdominal pain for 1 year.

EXAM:
CT ABDOMEN AND PELVIS WITHOUT CONTRAST
TECHNIQUE: Multidetector CT imaging of the abdomen and pelvis was performed
following the standard protocol without IV contrast.

[Series 4: axial st · axial · 0.75mm/px · z∈[-588,-113]mm · 13 of 107 slices shown, 15 images]
[im 6/107  soft-tissue]
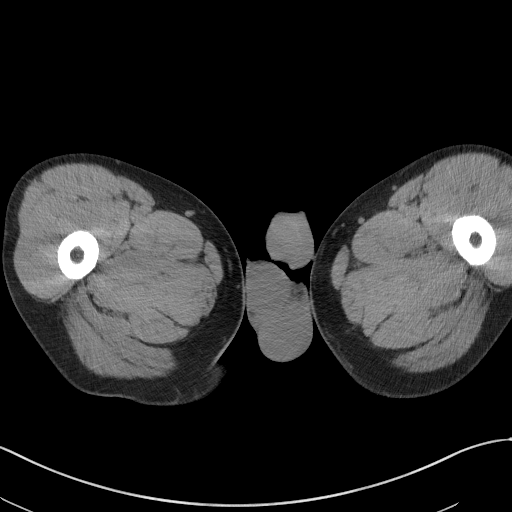
[im 6/107  bone]
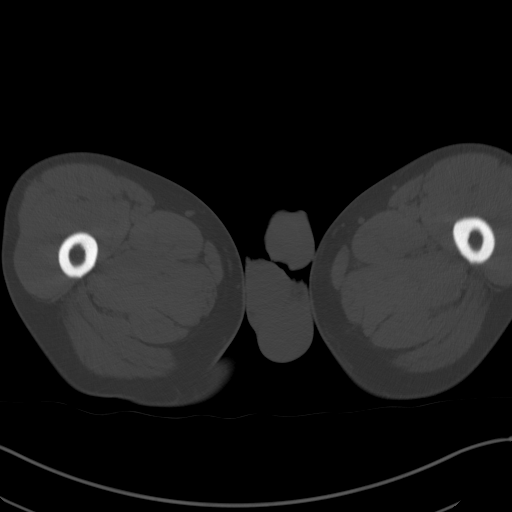
[im 17/107  soft-tissue]
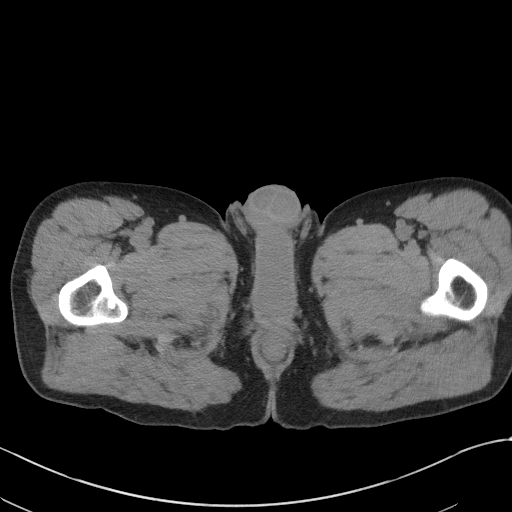
[im 23/107  soft-tissue]
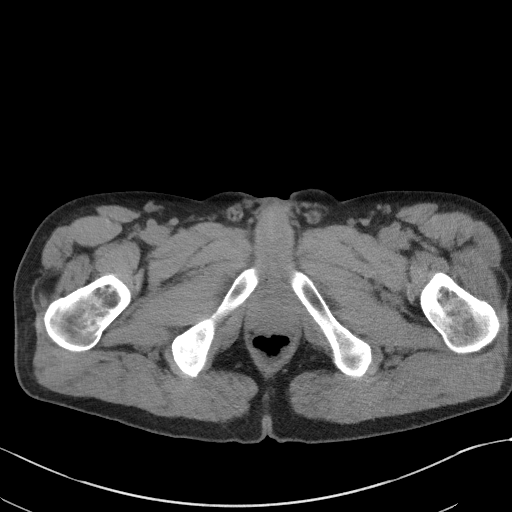
[im 28/107  soft-tissue]
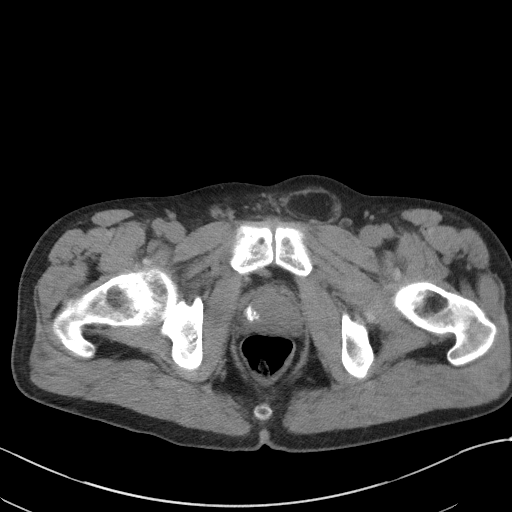
[im 40/107  soft-tissue]
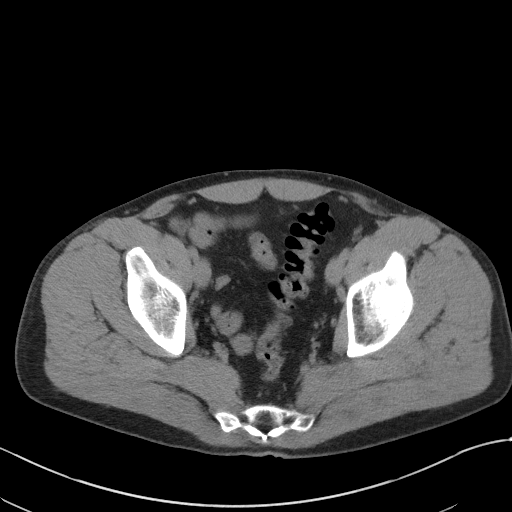
[im 45/107  soft-tissue]
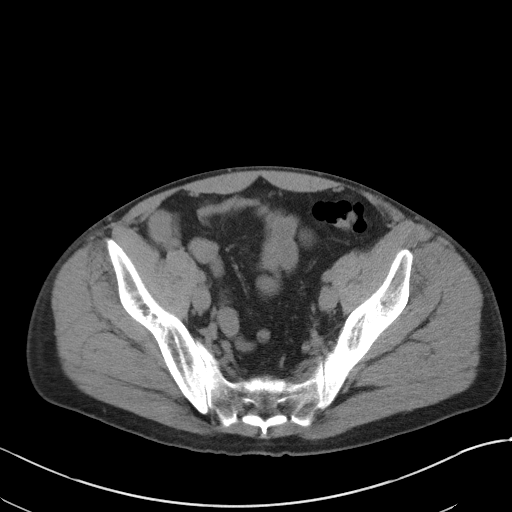
[im 56/107  soft-tissue]
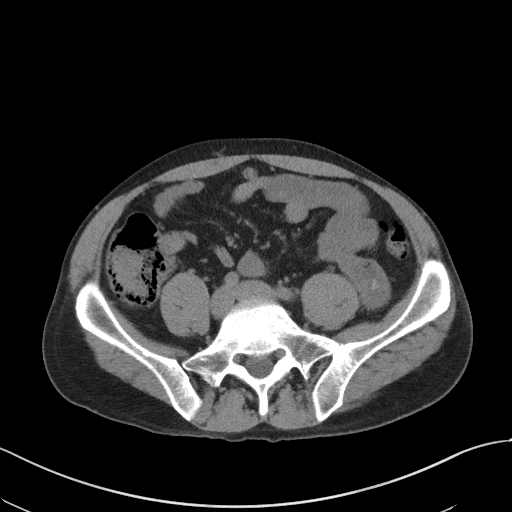
[im 62/107  soft-tissue]
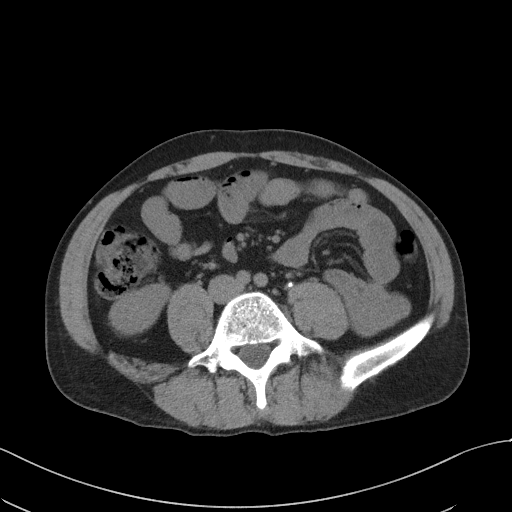
[im 67/107  soft-tissue]
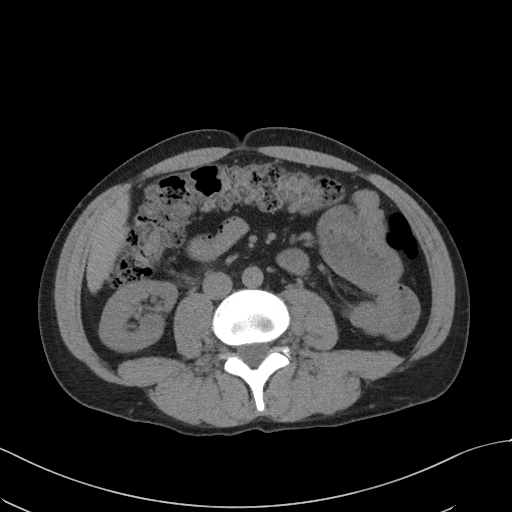
[im 67/107  bone]
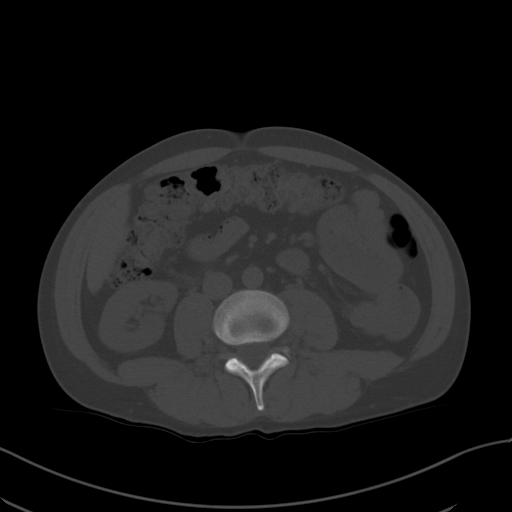
[im 79/107  soft-tissue]
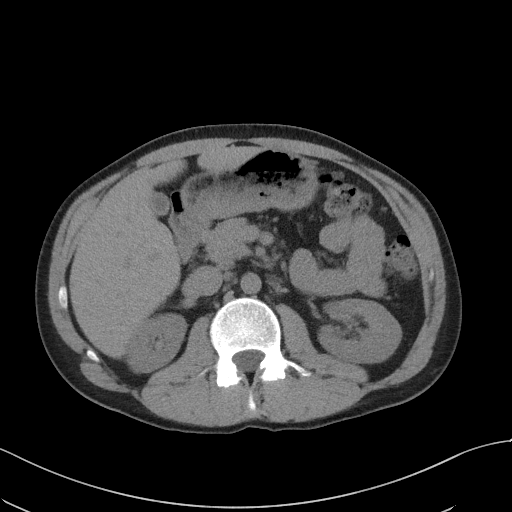
[im 84/107  soft-tissue]
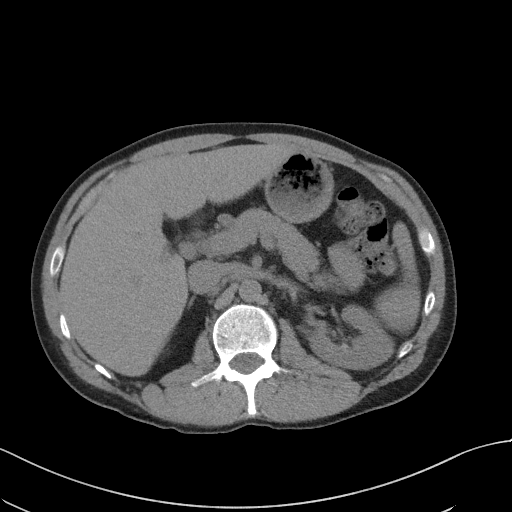
[im 90/107  soft-tissue]
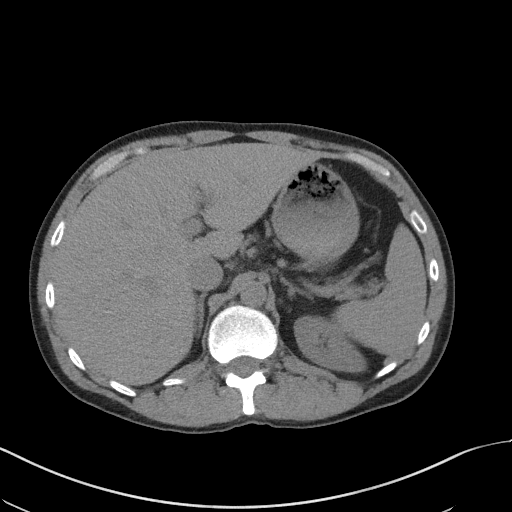
[im 101/107  soft-tissue]
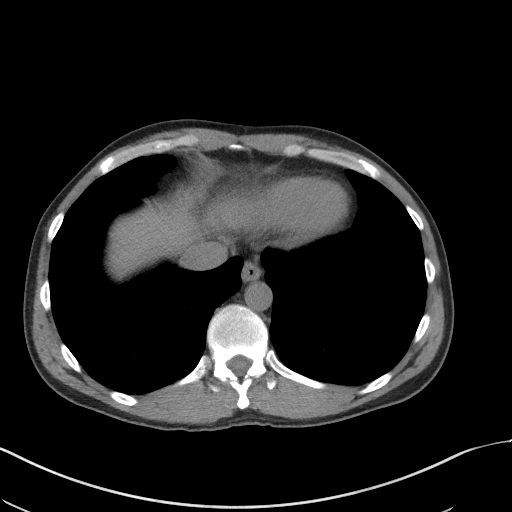

[Series 5: coronal st · coronal · 0.80mm/px · 3 of 86 slices shown]
[im 29/86  soft-tissue]
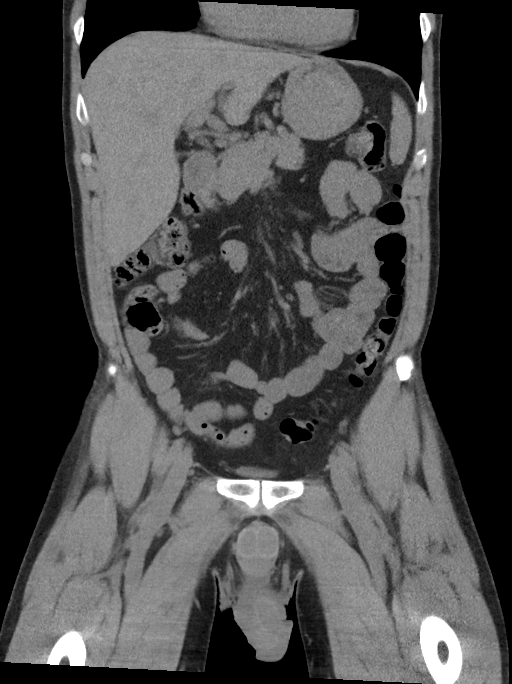
[im 38/86  soft-tissue]
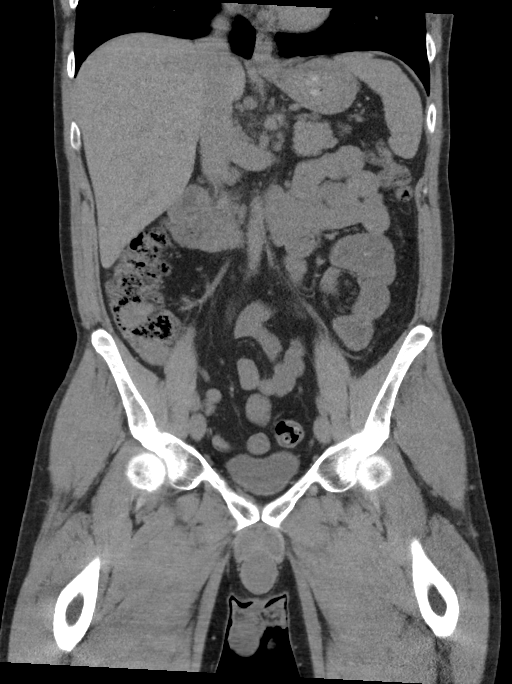
[im 48/86  soft-tissue]
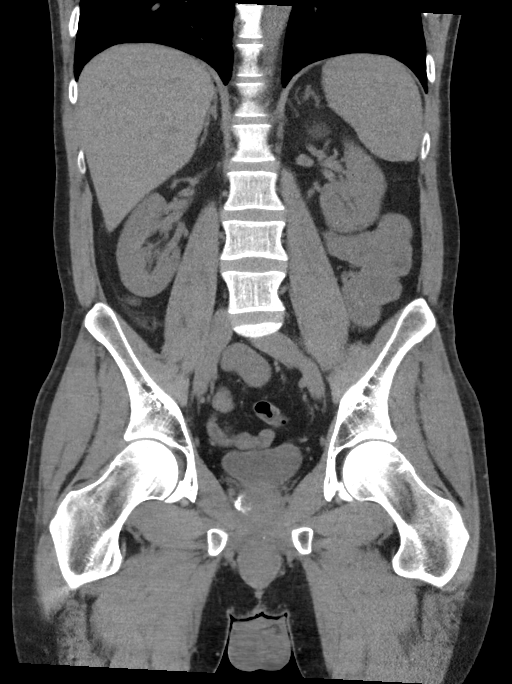

[16 of 46 positions shown; findings below may reference images not displayed]

FINDINGS: Lower chest: Minimal reticular scarring, lateral base of the right
lower lobe, stable. Lung bases otherwise clear.

Hepatobiliary: No focal liver abnormality is seen. No gallstones,
gallbladder wall thickening, or biliary dilatation.

Pancreas: Unremarkable. No pancreatic ductal dilatation or
surrounding inflammatory changes.

Spleen: Normal in size without focal abnormality.

Adrenals/Urinary Tract: No adrenal masses.

Kidneys normal in size, orientation and position. Small
nonobstructing stones in the left kidney. Low-attenuation masses or
lesions, medial mid to lower pole and lower pole of the right
kidney, 12 mm each, not fully characterized but likely cysts. No
other renal masses. No hydronephrosis. Normal ureters. Normal
bladder.

Stomach/Bowel: Stomach is within normal limits. Appendix appears
normal. No evidence of bowel wall thickening, distention, or
inflammatory changes.

Vascular/Lymphatic: No significant vascular findings are present. No
enlarged abdominal or pelvic lymph nodes.

Reproductive: Unremarkable.

Other: Fat containing left inguinal hernia. No other abdominal wall
hernia. No ascites.

Musculoskeletal: No acute or significant osseous findings.
IMPRESSION: 1. No acute findings within the abdomen or pelvis.
2. Small fat containing left inguinal hernia new/increased in size
from the prior CT, potentially a cause of left lower quadrant pain.
3. No ureteral stone or obstructive uropathy. Small nonobstructing
stones in the left kidney.
4. Two small low-density lesions in the right kidney, likely cyst
but not fully characterized. Recommend non urgent follow-up
assessment with renal ultrasound
